# Patient Record
Sex: Female | Born: 1960 | Race: White | Hispanic: No | Marital: Married | Smoking: Never smoker
Health system: Southern US, Community
[De-identification: ages and names within clinical notes are randomized; demographics above are authoritative.]

## PROBLEM LIST (undated history)

## (undated) DIAGNOSIS — R002 Palpitations: Secondary | ICD-10-CM

## (undated) DIAGNOSIS — N200 Calculus of kidney: Secondary | ICD-10-CM

## (undated) DIAGNOSIS — I10 Essential (primary) hypertension: Secondary | ICD-10-CM

## (undated) HISTORY — DX: Calculus of kidney: N20.0

## (undated) HISTORY — DX: Essential (primary) hypertension: I10

---

## 2013-05-29 DIAGNOSIS — I10 Essential (primary) hypertension: Secondary | ICD-10-CM | POA: Insufficient documentation

## 2013-05-29 DIAGNOSIS — E782 Mixed hyperlipidemia: Secondary | ICD-10-CM | POA: Insufficient documentation

## 2013-12-05 DIAGNOSIS — G959 Disease of spinal cord, unspecified: Secondary | ICD-10-CM | POA: Insufficient documentation

## 2017-12-18 DIAGNOSIS — R002 Palpitations: Secondary | ICD-10-CM | POA: Insufficient documentation

## 2019-04-05 ENCOUNTER — Other Ambulatory Visit: Payer: Self-pay

## 2019-04-05 ENCOUNTER — Encounter: Payer: Self-pay | Admitting: *Deleted

## 2019-04-05 ENCOUNTER — Emergency Department
Admission: EM | Admit: 2019-04-05 | Discharge: 2019-04-05 | Disposition: A | Discharge status: Home / Self Care | Payer: BC Managed Care – PPO | Attending: Emergency Medicine | Admitting: Emergency Medicine

## 2019-04-05 ENCOUNTER — Emergency Department: Payer: BC Managed Care – PPO

## 2019-04-05 DIAGNOSIS — N200 Calculus of kidney: Secondary | ICD-10-CM | POA: Diagnosis not present

## 2019-04-05 DIAGNOSIS — R1031 Right lower quadrant pain: Secondary | ICD-10-CM | POA: Diagnosis present

## 2019-04-05 DIAGNOSIS — R110 Nausea: Secondary | ICD-10-CM | POA: Diagnosis not present

## 2019-04-05 HISTORY — DX: Palpitations: R00.2

## 2019-04-05 LAB — URINALYSIS, COMPLETE (UACMP) WITH MICROSCOPIC
Bilirubin Urine: NEGATIVE
Glucose, UA: NEGATIVE mg/dL
Ketones, ur: NEGATIVE mg/dL
Leukocytes,Ua: NEGATIVE
Nitrite: NEGATIVE
Protein, ur: NEGATIVE mg/dL
RBC / HPF: >50 RBC/hpf (ref 0–5)
Specific Gravity, Urine: 1.01 (ref 1.005–1.030)
pH: 5 (ref 5.0–8.0)

## 2019-04-05 LAB — COMPREHENSIVE METABOLIC PANEL
ALT: 16 U/L (ref 0–44)
AST: 21 U/L (ref 15–41)
Albumin: 4.6 g/dL (ref 3.5–5.0)
Alkaline Phosphatase: 67 U/L (ref 38–126)
Anion gap: 8 (ref 5–15)
BUN: 30 mg/dL — ABNORMAL HIGH (ref 6–20)
CO2: 26 mmol/L (ref 22–32)
Calcium: 9.3 mg/dL (ref 8.9–10.3)
Chloride: 105 mmol/L (ref 98–111)
Creatinine, Ser: 1.01 mg/dL — ABNORMAL HIGH (ref 0.44–1.00)
GFR calc Af Amer: >60 mL/min (ref >60)
GFR calc non Af Amer: >60 mL/min (ref >60)
Glucose, Bld: 151 mg/dL — ABNORMAL HIGH (ref 70–99)
Potassium: 4.3 mmol/L (ref 3.5–5.1)
Sodium: 139 mmol/L (ref 135–145)
Total Bilirubin: 0.7 mg/dL (ref 0.3–1.2)
Total Protein: 7.5 g/dL (ref 6.5–8.1)

## 2019-04-05 LAB — CBC
HCT: 39.1 % (ref 36.0–46.0)
Hemoglobin: 12.2 g/dL (ref 12.0–15.0)
MCH: 28 pg (ref 26.0–34.0)
MCHC: 31.2 g/dL (ref 30.0–36.0)
MCV: 89.9 fL (ref 80.0–100.0)
Platelets: 163 10*3/uL (ref 150–400)
RBC: 4.35 MIL/uL (ref 3.87–5.11)
RDW: 13.7 % (ref 11.5–15.5)
WBC: 7.7 10*3/uL (ref 4.0–10.5)
nRBC: 0 % (ref 0.0–0.2)

## 2019-04-05 LAB — LIPASE, BLOOD: Lipase: 23 U/L (ref 11–51)

## 2019-04-05 MED ORDER — ONDANSETRON 4 MG PO TBDP: 4.0000 mg | ORAL_TABLET | Freq: Three times a day (TID) | ORAL | 0 refills | Status: DC | PRN

## 2019-04-05 MED ORDER — MORPHINE SULFATE (PF) 4 MG/ML IV SOLN
4.0000 mg | Freq: Once | INTRAVENOUS | Status: AC
  Administered 2019-04-05: 4 mg via INTRAVENOUS
  Filled 2019-04-05: qty 1

## 2019-04-05 MED ORDER — IOHEXOL 300 MG/ML  SOLN
100.0000 mL | Freq: Once | INTRAMUSCULAR | Status: AC | PRN
  Administered 2019-04-05: 100 mL via INTRAVENOUS

## 2019-04-05 MED ORDER — KETOROLAC TROMETHAMINE 30 MG/ML IJ SOLN
30.0000 mg | Freq: Once | INTRAMUSCULAR | Status: AC
  Administered 2019-04-05: 30 mg via INTRAVENOUS
  Filled 2019-04-05: qty 1

## 2019-04-05 MED ORDER — ONDANSETRON HCL 4 MG/2ML IJ SOLN
4.0000 mg | Freq: Once | INTRAMUSCULAR | Status: AC
  Administered 2019-04-05: 4 mg via INTRAVENOUS
  Filled 2019-04-05: qty 2

## 2019-04-05 MED ORDER — OXYCODONE-ACETAMINOPHEN 5-325 MG PO TABS: 1.0000 | ORAL_TABLET | ORAL | 0 refills | Status: AC | PRN

## 2019-04-05 NOTE — ED Notes (Signed)
Pt transported to CT at this time.

## 2019-04-05 NOTE — ED Triage Notes (Signed)
Pt to ED reporting right lower abd pain that started suddenly at midnight. Pt reports she has been able to urinate since pain started but now feels as though she needs to urinate but is unable to do so. Normal BMs. No tenderness to abd. Nausea without vomiting. Pt unable to sit still in chair. NO hx of kidney stones.

## 2019-04-05 NOTE — ED Provider Notes (Signed)
[facility] Emergency Department Provider Note _________   First MD Initiated Contact with Patient 04/05/19 [phone]     (approximate)  I have reviewed the triage vital signs and the nursing notes.   HISTORY  Chief Complaint Abdominal Pain    HPI April Shields is a 59 y.o. female presents to the emergency department secondary to acute onset of right lower quadrant abdominal pain with associated nausea that began at midnight tonight.  Patient states that pain was 10 out of 10 currently 8 out of 10 with continued nausea.  Patient denies any vomiting no diarrhea or constipation.  Patient admits to urge to urinate but only "trickles.  Patient denies any fever.       Past Medical History:  Diagnosis Date  . Palpitations     There are no problems to display for this patient.   History reviewed. No pertinent surgical history.  Prior to Admission medications   Medication Sig Start Date End Date Taking? Authorizing Provider  ondansetron (ZOFRAN ODT) 4 MG disintegrating tablet Take 1 tablet (4 mg total) by mouth every 8 (eight) hours as needed. 04/05/19   Gregor Hams, MD  oxyCODONE-acetaminophen (PERCOCET) 5-325 MG tablet Take 1 tablet by mouth every 4 (four) hours as needed. 04/05/19 04/04/20  Gregor Hams, MD    Allergies Tape  History reviewed. No pertinent family history.  Social History Social History   Tobacco Use  . Smoking status: Never Smoker  . Smokeless tobacco: Never Used  Substance Use Topics  . Alcohol use: Not Currently  . Drug use: Never    Review of Systems Constitutional: No fever/chills Eyes: No visual changes. ENT: No sore throat. Cardiovascular: Denies chest pain. Respiratory: Denies shortness of breath. Gastrointestinal: Positive for abdominal pain and nausea no diarrhea.  No constipation. Genitourinary: Negative for dysuria. Musculoskeletal: Negative for neck pain.  Negative for back pain. Integumentary: Negative for  rash. Neurological: Negative for headaches, focal weakness or numbness.   ____________________________________________   PHYSICAL EXAM:  VITAL SIGNS: ED Triage Vitals [04/05/19 0342]  Enc Vitals Group     BP      Pulse      Resp      Temp      Temp src      SpO2      Weight 69.4 kg (153 lb)     Height 1.575 m (5\' 2" )     Head Circumference      Peak Flow      Pain Score 10     Pain Loc      Pain Edu?      Excl. in [place]?     Constitutional: Alert and oriented.  Apparent discomfort. Eyes: Conjunctivae are normal. . Mouth/Throat: Patient is wearing a mask. Neck: No stridor.  No meningeal signs.   Cardiovascular: Normal rate, regular rhythm. Good peripheral circulation. Grossly normal heart sounds. Respiratory: Normal respiratory effort.  No retractions. Gastrointestinal: Soft and nontender. No distention.  Musculoskeletal: No lower extremity tenderness nor edema. No gross deformities of extremities. Neurologic:  Normal speech and language. No gross focal neurologic deficits are appreciated.  Skin:  Skin is warm, dry and intact. Psychiatric: Mood and affect are normal. Speech and behavior are normal.  ____________________________________________   LABS (all labs ordered are listed, but only abnormal results are displayed)  Labs Reviewed  COMPREHENSIVE METABOLIC PANEL - Abnormal; Notable for the following components:      Result Value   Glucose, Bld 151 (*)  BUN 30 (*)    Creatinine, Ser 1.01 (*)    All other components within normal limits  LIPASE, BLOOD  CBC  URINALYSIS, COMPLETE (UACMP) WITH MICROSCOPIC     RADIOLOGY I, Ault N Trayquan Kolakowski, personally viewed and evaluated these images (plain radiographs) as part of my medical decision making, as well as reviewing the written report by the radiologist.  ED MD interpretation: 3 mm distal right urethral calculus with moderate right-sided hydronephrosis.  Official radiology report(s): CT ABDOMEN PELVIS W  CONTRAST  Result Date: 04/05/2019 CLINICAL DATA:  Right lower quadrant abdominal pain since early this morning. EXAM: CT ABDOMEN AND PELVIS WITH CONTRAST TECHNIQUE: Multidetector CT imaging of the abdomen and pelvis was performed using the standard protocol following bolus administration of intravenous contrast. CONTRAST:  OMNIPAQUE IOHEXOL 300 MG/ML  SOLN COMPARISON:  None. FINDINGS: Lower chest: The lung bases are clear of acute process. No pleural effusion or pulmonary lesions. A few tiny calcified granulomas are noted at the right lung base. The heart is normal in size. No pericardial effusion. The distal esophagus and aorta are unremarkable. Age advanced aortic calcifications. Hepatobiliary: No focal hepatic lesions or intrahepatic biliary dilatation. The gallbladder is normal. No common bile duct dilatation. Pancreas: No mass, inflammation or ductal dilatation. Spleen: Normal size. No focal lesions. Adrenals/Urinary Tract: The adrenal glands are normal. There is moderate right-sided hydroureteronephrosis down to an obstructing 3 mm calculus near the right UVJ. No renal calculi are identified. Both kidneys demonstrate normal enhancement/perfusion. No worrisome pulmonary lesions. The bladder is unremarkable. Stomach/Bowel: The stomach, duodenum, small bowel and colon are unremarkable. No acute inflammatory changes, mass lesions or obstructive findings. The terminal ileum is normal. The appendix is normal. Vascular/Lymphatic: Moderate age advanced atherosclerotic calcifications involving the aorta, branch vessel ostia and iliac arteries. No mesenteric or retroperitoneal mass or adenopathy. Reproductive: The uterus and ovaries are unremarkable. Other: No pelvic mass or adenopathy. No free pelvic fluid collections. No inguinal mass or adenopathy. No abdominal wall hernia or subcutaneous lesions. Musculoskeletal: No significant bony findings. IMPRESSION: 1. 3 mm distal right ureteral calculus causing moderate  right-sided hydroureteronephrosis. 2. No renal calculi. 3. No other acute abdominal/pelvic findings, mass lesions or adenopathy. 4. Age advanced atherosclerotic calcifications involving the aorta and branch vessels. Aortic Atherosclerosis (ICD10-I70.0). Electronically Signed   By: Rudie Meyer M.D.   On: 04/05/2019 05:02    ____________________________________________   PROCEDURES   Procedure(s) performed (including Critical Care):  Procedures   ____________________________________________   INITIAL IMPRESSION / MDM / ASSESSMENT AND PLAN / ED COURSE  As part of my medical decision making, I reviewed the following data within the electronic MEDICAL RECORD NUMBER  59 year old female presented with above-stated history and physical exam consistent with ureterolithiasis was confirmed on CT.  Patient given IV morphine and Zofran with improvement of pain.  After reviewing patient CT scan patient was given IV Toradol with resolution of pain at present.  ____________________________________________  FINAL CLINICAL IMPRESSION(S) / ED DIAGNOSES  Final diagnoses:  Kidney stone     MEDICATIONS GIVEN DURING THIS VISIT:  Medications  ketorolac (TORADOL) 30 MG/ML injection 30 mg (has no administration in time range)  morphine 4 MG/ML injection 4 mg (4 mg Intravenous Given 04/05/19 0357)  ondansetron (ZOFRAN) injection 4 mg (4 mg Intravenous Given 04/05/19 0358)  iohexol (OMNIPAQUE) 300 MG/ML solution 100 mL (100 mLs Intravenous Contrast Given 04/05/19 0436)     ED Discharge Orders         Ordered  oxyCODONE-acetaminophen (PERCOCET) 5-325 MG tablet  Every 4 hours PRN     04/05/19 0516    ondansetron (ZOFRAN ODT) 4 MG disintegrating tablet  Every 8 hours PRN     04/05/19 0516          *Please note:  Thanya A Deschler was evaluated in Emergency Department on 04/05/2019 for the symptoms described in the history of present illness. She was evaluated in the context of the global COVID-19 pandemic,  which necessitated consideration that the patient might be at risk for infection with the SARS-CoV-2 virus that causes COVID-19. Institutional protocols and algorithms that pertain to the evaluation of patients at risk for COVID-19 are in a state of rapid change based on information released by regulatory bodies including the CDC and federal and state organizations. These policies and algorithms were followed during the patient's care in the ED.  Some ED evaluations and interventions may be delayed as a result of limited staffing during the pandemic.*  Note:  This document was prepared using Dragon voice recognition software and may include unintentional dictation errors.   Darci Current, MD 04/05/19 0630

## 2019-04-06 ENCOUNTER — Encounter: Payer: Self-pay | Admitting: Urology

## 2019-04-06 ENCOUNTER — Ambulatory Visit: Payer: BC Managed Care – PPO | Admitting: Urology

## 2019-04-06 VITALS — BP 116/70 | HR 54 | Ht 62.0 in | Wt 154.3 lb

## 2019-04-06 DIAGNOSIS — I499 Cardiac arrhythmia, unspecified: Secondary | ICD-10-CM | POA: Insufficient documentation

## 2019-04-06 DIAGNOSIS — N201 Calculus of ureter: Secondary | ICD-10-CM | POA: Diagnosis not present

## 2019-04-06 MED ORDER — FLUCONAZOLE 150 MG PO TABS: 150.0000 mg | ORAL_TABLET | Freq: Once | ORAL | 0 refills | Status: AC

## 2019-04-06 NOTE — Addendum Note (Signed)
Addended by: Frankey Shown on: 04/06/2019 04:34 PM   Modules accepted: Orders

## 2019-04-06 NOTE — Addendum Note (Signed)
Addended by: Frankey Shown on: 04/06/2019 04:21 PM   Modules accepted: Orders

## 2019-04-06 NOTE — Patient Instructions (Signed)
Lithotripsy  Lithotripsy is a treatment that can sometimes help eliminate kidney stones and the pain that they cause. A form of lithotripsy, also known as extracorporeal shock wave lithotripsy, is a nonsurgical procedure that crushes a kidney stone with shock waves. These shock waves pass through your body and focus on the kidney stone. They cause the kidney stone to break up while it is still in the urinary tract. This makes it easier for the smaller pieces of stone to pass in the urine. Tell a health care provider about:  Any allergies you have.  All medicines you are taking, including vitamins, herbs, eye drops, creams, and over-the-counter medicines.  Any blood disorders you have.  Any surgeries you have had.  Any medical conditions you have.  Whether you are pregnant or may be pregnant.  Any problems you or family members have had with anesthetic medicines. What are the risks? Generally, this is a safe procedure. However, problems may occur, including:  Infection.  Bleeding of the kidney.  Bruising of the kidney or skin.  Scarring of the kidney, which can lead to: ? Increased blood pressure. ? Poor kidney function. ? Return (recurrence) of kidney stones.  Damage to other structures or organs, such as the liver, colon, spleen, or pancreas.  Blockage (obstruction) of the the tube that carries urine from the kidney to the bladder (ureter).  Failure of the kidney stone to break into pieces (fragments). What happens before the procedure? Staying hydrated Follow instructions from your health care provider about hydration, which may include:  Up to 2 hours before the procedure - you may continue to drink clear liquids, such as water, clear fruit juice, black coffee, and plain tea. Eating and drinking restrictions Follow instructions from your health care provider about eating and drinking, which may include:  8 hours before the procedure - stop eating heavy meals or foods  such as meat, fried foods, or fatty foods.  6 hours before the procedure - stop eating light meals or foods, such as toast or cereal.  6 hours before the procedure - stop drinking milk or drinks that contain milk.  2 hours before the procedure - stop drinking clear liquids. General instructions  Plan to have someone take you home from the hospital or clinic.  Ask your health care provider about: ? Changing or stopping your regular medicines. This is especially important if you are taking diabetes medicines or blood thinners. ? Taking medicines such as aspirin and ibuprofen. These medicines and other NSAIDs can thin your blood. Do not take these medicines for 7 days before your procedure if your health care provider instructs you not to.  You may have tests, such as: ? Blood tests. ? Urine tests. ? Imaging tests, such as a CT scan. What happens during the procedure?  To lower your risk of infection: ? Your health care team will wash or sanitize their hands. ? Your skin will be washed with soap.  An IV tube will be inserted into one of your veins. This tube will give you fluids and medicines.  You will be given one or more of the following: ? A medicine to help you relax (sedative). ? A medicine to make you fall asleep (general anesthetic).  A water-filled cushion may be placed behind your kidney or on your abdomen. In some cases you may be placed in a tub of lukewarm water.  Your body will be positioned in a way that makes it easy to target the kidney   stone.  A flexible tube with holes in it (stent) may be placed in the ureter. This will help keep urine flowing from the kidney if the fragments of the stone have been blocking the ureter.  An X-ray or ultrasound exam will be done to locate your stone.  Shock waves will be aimed at the stone. If you are awake, you may feel a tapping sensation as the shock waves pass through your body. The procedure may vary among health care  providers and hospitals. What happens after the procedure?  You may have an X-ray to see whether the procedure was able to break up the kidney stone and how much of the stone has passed. If large stone fragments remain after treatment, you may need to have a second procedure at a later time.  Your blood pressure, heart rate, breathing rate, and blood oxygen level will be monitored until the medicines you were given have worn off.  You may be given antibiotics or pain medicine as needed.  If a stent was placed in your ureter during surgery, it may stay in place for a few weeks.  You may need strain your urine to collect pieces of the kidney stone for testing.  You will need to drink plenty of water.  Do not drive for 24 hours if you were given a sedative. Summary  Lithotripsy is a treatment that can sometimes help eliminate kidney stones and the pain that they cause.  A form of lithotripsy, also known as extracorporeal shock wave lithotripsy, is a nonsurgical procedure that crushes a kidney stone with shock waves.  Generally, this is a safe procedure. However, problems may occur, including damage to the kidney or other organs, infection, or obstruction of the tube that carries urine from the kidney to the bladder (ureter).  When you go home, you will need to drink plenty of water. You may be asked to strain your urine to collect pieces of the kidney stone for testing. This information is not intended to replace advice given to you by your health care provider. Make sure you discuss any questions you have with your health care provider. Document Revised: 04/26/2018 Document Reviewed: 12/05/2015 Elsevier Patient Education  2020 Elsevier Inc.  

## 2019-04-06 NOTE — Progress Notes (Signed)
04/06/19 3:54 PM   April Shields [birth year] [id number]  CC: Right groin pain, urinary frequency  HPI: I saw April Shields in urology clinic today for evaluation of right groin pain and urinary frequency secondary to a distal ureteral stone.  She is a healthy 59 year old female with no history of nephrolithiasis who presented overnight on 04/05/2019 with severe right-sided groin pain and urinary frequency.  CT showed a 4-52mm right distal ureteral stone with moderate upstream hydronephrosis.  There were no other renal stones.  Urinalysis was benign with greater than 50 RBCs, 0-5 WBCs, rare bacteria, and nitrite negative, yeast present.  She had no leukocytosis and was afebrile and hemodynamically stable.  She denies any fevers or chills.  Her pain is poorly controlled with right lower groin pain and urinary frequency.   PMH: Past Medical History:  Diagnosis Date  . HTN (hypertension)   . Kidney stone   . Palpitations     Surgical History: No past surgical history on file.  Family History: No family history on file.  Social History:  reports that she has never smoked. She has never used smokeless tobacco. She reports previous alcohol use. She reports that she does not use drugs.  Physical Exam: BP 116/70 (BP Location: Left Arm, Patient Position: Sitting, Cuff Size: Normal)   Pulse (!) 54   Ht 5\' 2"  (1.575 m)   Wt 154 lb 4.8 oz (70 kg)   BMI 28.22 kg/m    Constitutional:  Alert and oriented, No acute distress. Cardiovascular: Regular rate and rhythm Respiratory: Clear to auscultation bilaterally GI: Abdomen is soft, nontender, nondistended, no abdominal masses Lymph: No cervical or inguinal lymphadenopathy. Skin: No rashes, bruises or suspicious lesions. Neurologic: Grossly intact, no focal deficits, moving all 4 extremities. Psychiatric: Normal mood and affect.  Laboratory Data: Reviewed, see HPI  Pertinent Imaging: Personally reviewed the CT dated 04/05/2018.  5 mm right distal  ureteral stone with upstream hydronephrosis, no other renal stones. 550HU, clearly seen on scout CT, 9cm SSD  Assessment & Plan:   She is a 59 year old female with poorly controlled right-sided flank pain and urinary frequency secondary to a 5 mm right distal ureteral stone.  She has no signs of systemic infection.  There was yeast present on her original urinalysis, but urinalysis was otherwise completely benign with 0 WBCs.  One-time dose of fluconazole given.    We discussed various treatment options for urolithiasis including observation with or without medical expulsive therapy, shockwave lithotripsy (SWL), ureteroscopy and laser lithotripsy with stent placement, and percutaneous nephrolithotomy.  We discussed that management is based on stone size, location, density, patient co-morbidities, and patient preference.   Stones <34mm in size have a >80% spontaneous passage rate. Data surrounding the use of tamsulosin for medical expulsive therapy is controversial, but meta analyses suggests it is most efficacious for distal stones between 5-23mm in size. Possible side effects include dizziness/lightheadedness, and retrograde ejaculation.  SWL has a lower stone free rate in a single procedure, but also a lower complication rate compared to ureteroscopy and avoids a stent and associated stent related symptoms. Possible complications include renal hematoma, steinstrasse, and need for additional treatment.  Ureteroscopy with laser lithotripsy and stent placement has a higher stone free rate than SWL in a single procedure, however increased complication rate including possible infection, ureteral injury, bleeding, and stent related morbidity. Common stent related symptoms include dysuria, urgency/frequency, and flank pain.  After an extensive discussion of the risks and benefits of the above  treatment options, the patient would like to proceed with right shockwave lithotripsy.  Brian Sninsky,  MD 04/06/2019  [facility] [address], [address] [place], [place] [address] [phone]  

## 2019-04-06 NOTE — H&P (View-Only) (Signed)
04/06/19 3:54 PM   April Shields [birth year] [id number]  CC: Right groin pain, urinary frequency  HPI: I saw April Shields in urology clinic today for evaluation of right groin pain and urinary frequency secondary to a distal ureteral stone.  She is a healthy 59 year old female with no history of nephrolithiasis who presented overnight on 04/05/2019 with severe right-sided groin pain and urinary frequency.  CT showed a 4-52mm right distal ureteral stone with moderate upstream hydronephrosis.  There were no other renal stones.  Urinalysis was benign with greater than 50 RBCs, 0-5 WBCs, rare bacteria, and nitrite negative, yeast present.  She had no leukocytosis and was afebrile and hemodynamically stable.  She denies any fevers or chills.  Her pain is poorly controlled with right lower groin pain and urinary frequency.   PMH: Past Medical History:  Diagnosis Date  . HTN (hypertension)   . Kidney stone   . Palpitations     Surgical History: No past surgical history on file.  Family History: No family history on file.  Social History:  reports that she has never smoked. She has never used smokeless tobacco. She reports previous alcohol use. She reports that she does not use drugs.  Physical Exam: BP 116/70 (BP Location: Left Arm, Patient Position: Sitting, Cuff Size: Normal)   Pulse (!) 54   Ht 5\' 2"  (1.575 m)   Wt 154 lb 4.8 oz (70 kg)   BMI 28.22 kg/m    Constitutional:  Alert and oriented, No acute distress. Cardiovascular: Regular rate and rhythm Respiratory: Clear to auscultation bilaterally GI: Abdomen is soft, nontender, nondistended, no abdominal masses Lymph: No cervical or inguinal lymphadenopathy. Skin: No rashes, bruises or suspicious lesions. Neurologic: Grossly intact, no focal deficits, moving all 4 extremities. Psychiatric: Normal mood and affect.  Laboratory Data: Reviewed, see HPI  Pertinent Imaging: Personally reviewed the CT dated 04/05/2018.  5 mm right distal  ureteral stone with upstream hydronephrosis, no other renal stones. 550HU, clearly seen on scout CT, 9cm SSD  Assessment & Plan:   She is a 59 year old female with poorly controlled right-sided flank pain and urinary frequency secondary to a 5 mm right distal ureteral stone.  She has no signs of systemic infection.  There was yeast present on her original urinalysis, but urinalysis was otherwise completely benign with 0 WBCs.  One-time dose of fluconazole given.    We discussed various treatment options for urolithiasis including observation with or without medical expulsive therapy, shockwave lithotripsy (SWL), ureteroscopy and laser lithotripsy with stent placement, and percutaneous nephrolithotomy.  We discussed that management is based on stone size, location, density, patient co-morbidities, and patient preference.   Stones <34mm in size have a >80% spontaneous passage rate. Data surrounding the use of tamsulosin for medical expulsive therapy is controversial, but meta analyses suggests it is most efficacious for distal stones between 5-23mm in size. Possible side effects include dizziness/lightheadedness, and retrograde ejaculation.  SWL has a lower stone free rate in a single procedure, but also a lower complication rate compared to ureteroscopy and avoids a stent and associated stent related symptoms. Possible complications include renal hematoma, steinstrasse, and need for additional treatment.  Ureteroscopy with laser lithotripsy and stent placement has a higher stone free rate than SWL in a single procedure, however increased complication rate including possible infection, ureteral injury, bleeding, and stent related morbidity. Common stent related symptoms include dysuria, urgency/frequency, and flank pain.  After an extensive discussion of the risks and benefits of the above  treatment options, the patient would like to proceed with right shockwave lithotripsy.  Nickolas Madrid,  MD 04/06/2019  [facility] [address] [place], [place] [address] [phone]

## 2019-04-07 ENCOUNTER — Encounter: Payer: Self-pay | Admitting: Urology

## 2019-04-07 ENCOUNTER — Encounter
Admission: RE | Disposition: A | Discharge status: Home / Self Care | Payer: Self-pay | Source: Home / Self Care | Attending: Urology

## 2019-04-07 ENCOUNTER — Other Ambulatory Visit: Payer: Self-pay

## 2019-04-07 ENCOUNTER — Other Ambulatory Visit: Payer: Self-pay | Admitting: Radiology

## 2019-04-07 ENCOUNTER — Ambulatory Visit
Admission: RE | Admit: 2019-04-07 | Discharge: 2019-04-07 | Disposition: A | Discharge status: Home / Self Care | Payer: BC Managed Care – PPO | Attending: Urology | Admitting: Urology

## 2019-04-07 ENCOUNTER — Ambulatory Visit: Payer: BC Managed Care – PPO

## 2019-04-07 DIAGNOSIS — Z87442 Personal history of urinary calculi: Secondary | ICD-10-CM | POA: Insufficient documentation

## 2019-04-07 DIAGNOSIS — N200 Calculus of kidney: Secondary | ICD-10-CM

## 2019-04-07 DIAGNOSIS — N201 Calculus of ureter: Secondary | ICD-10-CM

## 2019-04-07 DIAGNOSIS — N132 Hydronephrosis with renal and ureteral calculous obstruction: Secondary | ICD-10-CM | POA: Insufficient documentation

## 2019-04-07 DIAGNOSIS — I10 Essential (primary) hypertension: Secondary | ICD-10-CM | POA: Insufficient documentation

## 2019-04-07 HISTORY — PX: EXTRACORPOREAL SHOCK WAVE LITHOTRIPSY: SHX1557

## 2019-04-07 SURGERY — LITHOTRIPSY, ESWL
Anesthesia: Moderate Sedation | Laterality: Right

## 2019-04-07 MED ORDER — ONDANSETRON HCL 4 MG/2ML IJ SOLN
4.0000 mg | Freq: Once | INTRAMUSCULAR | Status: AC
  Administered 2019-04-07: 4 mg via INTRAVENOUS

## 2019-04-07 MED ORDER — DIAZEPAM 5 MG PO TABS
ORAL_TABLET | ORAL | Status: AC
  Filled 2019-04-07: qty 2

## 2019-04-07 MED ORDER — CIPROFLOXACIN HCL 500 MG PO TABS
500.0000 mg | ORAL_TABLET | ORAL | Status: AC
  Administered 2019-04-07: 500 mg via ORAL

## 2019-04-07 MED ORDER — CIPROFLOXACIN HCL 500 MG PO TABS
ORAL_TABLET | ORAL | Status: AC
  Filled 2019-04-07: qty 1

## 2019-04-07 MED ORDER — SODIUM CHLORIDE 0.9 % IV SOLN: INTRAVENOUS | Status: DC

## 2019-04-07 MED ORDER — DIAZEPAM 5 MG PO TABS
10.0000 mg | ORAL_TABLET | ORAL | Status: AC
  Administered 2019-04-07: 13:00:00 10 mg via ORAL

## 2019-04-07 MED ORDER — DIPHENHYDRAMINE HCL 25 MG PO CAPS
ORAL_CAPSULE | ORAL | Status: AC
  Filled 2019-04-07: qty 1

## 2019-04-07 MED ORDER — DIPHENHYDRAMINE HCL 25 MG PO CAPS
25.0000 mg | ORAL_CAPSULE | ORAL | Status: AC
  Administered 2019-04-07: 25 mg via ORAL

## 2019-04-07 MED ORDER — ALFUZOSIN HCL ER 10 MG PO TB24: 10.0000 mg | ORAL_TABLET | Freq: Every day | ORAL | 0 refills | Status: DC

## 2019-04-07 MED ORDER — ONDANSETRON HCL 4 MG/2ML IJ SOLN
INTRAMUSCULAR | Status: AC
  Filled 2019-04-07: qty 2

## 2019-04-07 NOTE — Discharge Instructions (Addendum)
FOLLOW [facility] DISCHARGE INSTRUCTION SHEET.  AMBULATORY SURGERY  DISCHARGE INSTRUCTIONS   1) The drugs that you were given will stay in your system until tomorrow so for the next 24 hours you should not:  A) Drive an automobile B) Make any legal decisions C) Drink any alcoholic beverage   2) You may resume regular meals tomorrow.  Today it is better to start with liquids and gradually work up to solid foods.  You may eat anything you prefer, but it is better to start with liquids, then soup and crackers, and gradually work up to solid foods.   3) Please notify your doctor immediately if you have any unusual bleeding, trouble breathing, redness and pain at the surgery site, drainage, fever, or pain not relieved by medication.    4) Additional Instructions: You will be contacted by our office for a follow-up visit in 1-2 weeks.  A prescription was sent to your pharmacy for alfuzosin which is a medication which will help you pass stone fragments.        Please contact your physician with any problems or Same Day Surgery at [phone], Monday through Friday 6 am to 4 pm, or [place] at [facility] number at [phone].

## 2019-04-07 NOTE — Interval H&P Note (Signed)
History and Physical Interval Note:  04/07/2019 4:35 PM  April Shields A Dunlap  has presented today for surgery, with the diagnosis of Kidney stone.  The various methods of treatment have been discussed with the patient and family. After consideration of risks, benefits and other options for treatment, the patient has consented to  Procedure(s): EXTRACORPOREAL SHOCK WAVE LITHOTRIPSY (ESWL) (Right) as a surgical intervention.  The patient's history has been reviewed, patient examined, no change in status, stable for surgery.  I have reviewed the patient's chart and labs.  Questions were answered to the patient's satisfaction.     Sierah Lacewell C Chailyn Racette

## 2019-04-13 ENCOUNTER — Ambulatory Visit: Payer: BC Managed Care – PPO | Admitting: Urology

## 2019-04-18 ENCOUNTER — Ambulatory Visit: Payer: BC Managed Care – PPO | Admitting: Physician Assistant

## 2019-04-18 ENCOUNTER — Ambulatory Visit
Admission: RE | Admit: 2019-04-18 | Discharge: 2019-04-18 | Disposition: A | Discharge status: Home / Self Care | Payer: BC Managed Care – PPO | Source: Ambulatory Visit | Attending: Urology | Admitting: Urology

## 2019-04-18 ENCOUNTER — Other Ambulatory Visit: Payer: Self-pay

## 2019-04-18 VITALS — BP 130/79 | HR 53 | Ht 62.0 in | Wt 156.0 lb

## 2019-04-18 DIAGNOSIS — N201 Calculus of ureter: Secondary | ICD-10-CM | POA: Diagnosis not present

## 2019-04-18 MED ORDER — TAMSULOSIN HCL 0.4 MG PO CAPS: 0.4000 mg | ORAL_CAPSULE | Freq: Every day | ORAL | 0 refills | Status: DC

## 2019-04-18 NOTE — Progress Notes (Signed)
04/18/2019 9:31 AM   April Shields [birth year] [id number]  CC: Postop ESWL  HPI: April Shields is a 59 y.o. female who presents for postoperative evaluation s/p ESWL with Dr. Bernardo Heater on 04/07/2019 for management of a 4-5 mm distal right ureteral stone.  Operative findings notable for fragmentation of the stone under fluoroscopy.  Today, she reports improvement in her pain since procedure. She denies fever, chills, nausea, and vomiting. She reports intermittent mild gross hematuria. She has been passing fragments and brings some with her to clinic today. She is not taking Flomax. She reports she needs to increase her fluid intake  Follow-up KUB today revealed persistence of the distal right ureteral stone.  PMH: Past Medical History:  Diagnosis Date  . HTN (hypertension)   . Kidney stone   . Palpitations     Surgical History: Past Surgical History:  Procedure Laterality Date  . EXTRACORPOREAL SHOCK WAVE LITHOTRIPSY Right 04/07/2019   Procedure: EXTRACORPOREAL SHOCK WAVE LITHOTRIPSY (ESWL);  Surgeon: Abbie Sons, MD;  Location: [facility] ORS;  Service: Urology;  Laterality: Right;    Home Medications:  Allergies as of 04/18/2019      Reactions   Tape Rash      Medication List       Accurate as of April 18, 2019  9:31 AM. If you have any questions, ask your nurse or doctor.        alfuzosin 10 MG 24 hr tablet Commonly known as: UROXATRAL Take 1 tablet (10 mg total) by mouth daily with breakfast.   atorvastatin 40 MG tablet Commonly known as: LIPITOR Take 40 mg by mouth every morning.   atorvastatin 80 MG tablet Commonly known as: LIPITOR Take 80 mg by mouth every morning.   Bayer Aspirin EC Low Dose 81 MG EC tablet Generic drug: aspirin Take 81 mg by mouth daily. Swallow whole.   famotidine 20 MG tablet Commonly known as: PEPCID Take 20 mg by mouth 2 (two) times daily.   fluconazole 150 MG tablet Commonly known as: DIFLUCAN Take 150 mg by mouth once.     hydrochlorothiazide 12.5 MG tablet Commonly known as: HYDRODIURIL Take 12.5 mg by mouth daily.   lisinopril 2.5 MG tablet Commonly known as: ZESTRIL Take 2.5 mg by mouth 2 (two) times daily.   mesalamine 0.375 g 24 hr capsule Commonly known as: APRISO Take 1.5 g by mouth every morning.   metoprolol succinate 25 MG 24 hr tablet Commonly known as: TOPROL-XL Take 25 mg by mouth daily.   ondansetron 4 MG disintegrating tablet Commonly known as: Zofran ODT Take 1 tablet (4 mg total) by mouth every 8 (eight) hours as needed.   ondansetron 4 MG tablet Commonly known as: ZOFRAN Take by mouth.   oxyCODONE-acetaminophen 5-325 MG tablet Commonly known as: Percocet Take 1 tablet by mouth every 4 (four) hours as needed.       Allergies:  Allergies  Allergen Reactions  . Tape Rash    Family History: No family history on file.  Social History:  reports that she has never smoked. She has never used smokeless tobacco. She reports previous alcohol use. She reports that she does not use drugs.  Physical Exam: BP 130/79   Pulse (!) 53   Ht 5\' 2"  (1.575 m)   Wt 156 lb (70.8 kg)   BMI 28.53 kg/m   Constitutional:  Alert and oriented, No acute distress. HEENT: [place] AT, moist mucus membranes.  Trachea midline, no masses. Cardiovascular: No clubbing,  cyanosis, or edema. Respiratory: Normal respiratory effort, no increased work of breathing. Skin: No rashes, bruises or suspicious lesions. Neurologic: Grossly intact, no focal deficits, moving all 4 extremities. Psychiatric: Normal mood and affect.  Pertinent Imaging: KUB 04/18/2019: CLINICAL DATA:  Right ureteral stone since 03/09, lithotripsy on 03/11.  EXAM: ABDOMEN - 1 VIEW  COMPARISON:  CT of the abdomen and pelvis dated 04/05/2019, plain film abdomen 04/07/2019  FINDINGS: Pattern of calcifications over the pelvis is unchanged since the recent plain film evaluation.  Lung bases are clear.  Bowel gas pattern is  unremarkable.  No abnormal calcifications over the renal contours, partially obscured by stool and gas.  Degenerative changes in the right hip. No acute bone finding.  IMPRESSION: Pattern of calcifications over the pelvis is unchanged since the recent plain film evaluation.  Comparison with CT shows that there are 2 phleboliths in the right hemipelvis and 2 calcifications just over the symphysis pubis. Accounting for these calcifications, the distal right ureteral calculus may no longer be present. Correlate with patient's symptoms and CT as warranted for further evaluation.   Electronically Signed   By: Donzetta Kohut M.D.   On: 04/18/2019 09:03  I personally reviewed the images referenced above and note persistent distal right ureteral stone.  Assessment & Plan:   1. Right ureteral stone 59 year old female who recently underwent ESWL for management of a 4-5 mm distal right ureteral stone presents today for follow-up.  Stone was noted to fragment on fluoroscopy during procedure.  She has been passing fragments.  KUB today notable for persistence of the distal right ureteral stone.  In the setting of her symptomatic improvement, recommend continued trial of passage of stone fragments.  I have prescribed Flomax 0.4 mg daily x1 month and would like her to follow-up with me in clinic in 1 month with a renal ultrasound prior to evaluate her for resolution of right hydronephrosis.  Counseled her to push fluids in the interim.  We will send her stone fragments for analysis.  I reviewed warning signs of kidney stones including sudden onset flank or abdominal pain, fever, chills, nausea, and vomiting, and counseled the patient to contact her office immediately if she develops these in the interim.  If these occur outside of office hours, I counseled her to proceed to the emergency room.  She expressed understanding. - US RENAL; Future - tamsulosin (FLOMAX) 0.4 MG CAPS capsule; Take 1  capsule (0.4 mg total) by mouth daily.  Dispense: 30 capsule; Refill: 0   Return in about 4 weeks (around 05/16/2019) for Stone f/u with RUS prior.  Carman Ching, PA-C  [facility] [address] [place], [place] [address] [phone]

## 2019-04-26 ENCOUNTER — Other Ambulatory Visit: Payer: Self-pay | Admitting: Physician Assistant

## 2019-05-11 ENCOUNTER — Ambulatory Visit
Admission: RE | Admit: 2019-05-11 | Discharge: 2019-05-11 | Disposition: A | Discharge status: Home / Self Care | Payer: BC Managed Care – PPO | Source: Ambulatory Visit | Attending: Physician Assistant | Admitting: Physician Assistant

## 2019-05-11 ENCOUNTER — Other Ambulatory Visit: Payer: Self-pay

## 2019-05-11 DIAGNOSIS — N201 Calculus of ureter: Secondary | ICD-10-CM | POA: Diagnosis not present

## 2019-05-16 ENCOUNTER — Ambulatory Visit (INDEPENDENT_AMBULATORY_CARE_PROVIDER_SITE_OTHER): Payer: BC Managed Care – PPO | Admitting: Physician Assistant

## 2019-05-16 ENCOUNTER — Other Ambulatory Visit: Payer: Self-pay

## 2019-05-16 VITALS — BP 145/84 | HR 58 | Ht 62.0 in | Wt 156.0 lb

## 2019-05-16 DIAGNOSIS — N201 Calculus of ureter: Secondary | ICD-10-CM

## 2019-05-16 DIAGNOSIS — Z87442 Personal history of urinary calculi: Secondary | ICD-10-CM

## 2019-05-16 LAB — MICROSCOPIC EXAMINATION

## 2019-05-16 LAB — URINALYSIS, COMPLETE
Bilirubin, UA: NEGATIVE
Glucose, UA: NEGATIVE
Ketones, UA: NEGATIVE
Nitrite, UA: NEGATIVE
Protein,UA: NEGATIVE
Specific Gravity, UA: 1.025 (ref 1.005–1.030)
Urobilinogen, Ur: 0.2 mg/dL (ref 0.2–1.0)
pH, UA: 6 (ref 5.0–7.5)

## 2019-05-16 NOTE — Progress Notes (Signed)
05/16/2019 10:22 AM   April Shields [birth year] [id number]  CC: Joaquim Lai follow-up  HPI: April Shields is a 59 y.o. female who presents today for second follow-up appointment following ESWL with Dr. Bernardo Heater on 04/07/2019 for management of a 4 to 5 mm distal right ureteral stone.  Operative findings notable for fragmentation of the stone under fluoroscopy.  I saw her in clinic on 04/18/2019, at which point she reported improvement in her pain since the procedure.  She did have intermittent mild gross hematuria at that time.  She brought stone fragments with her to clinic that day.  KUB revealed persistence of the distal right ureteral stone.  I counseled her to continue Flomax and push fluids with plans for follow-up renal ultrasound and recheck in 1 month.  Today, she reports continued resolution of her flank pain.  She denies gross hematuria.  She is no longer straining her urine, but does not believe she has seen any fragments pass.  Stone analysis resulted with 99% organic material/cellulose, 1% calcium oxalate monohydrate.  Renal ultrasound on 05/11/2019 revealed no nephrolithiasis or hydronephrosis.  In-office UA today positive for trace-lysed blood and 1+ leukocyte esterase; urine microscopy pan negative.  PMH: Past Medical History:  Diagnosis Date  . HTN (hypertension)   . Kidney stone   . Palpitations     Surgical History: Past Surgical History:  Procedure Laterality Date  . EXTRACORPOREAL SHOCK WAVE LITHOTRIPSY Right 04/07/2019   Procedure: EXTRACORPOREAL SHOCK WAVE LITHOTRIPSY (ESWL);  Surgeon: Abbie Sons, MD;  Location: [facility] ORS;  Service: Urology;  Laterality: Right;    Home Medications:  Allergies as of 05/16/2019      Reactions   Tape Rash      Medication List       Accurate as of May 16, 2019 10:22 AM. If you have any questions, ask your nurse or doctor.        alfuzosin 10 MG 24 hr tablet Commonly known as: UROXATRAL Take 1 tablet (10 mg total) by mouth  daily with breakfast.   atorvastatin 40 MG tablet Commonly known as: LIPITOR Take 40 mg by mouth every morning.   atorvastatin 80 MG tablet Commonly known as: LIPITOR Take 80 mg by mouth every morning.   Bayer Aspirin EC Low Dose 81 MG EC tablet Generic drug: aspirin Take 81 mg by mouth daily. Swallow whole.   famotidine 20 MG tablet Commonly known as: PEPCID Take 20 mg by mouth 2 (two) times daily.   fluconazole 150 MG tablet Commonly known as: DIFLUCAN Take 150 mg by mouth once.   hydrochlorothiazide 12.5 MG tablet Commonly known as: HYDRODIURIL Take 12.5 mg by mouth daily.   lisinopril 2.5 MG tablet Commonly known as: ZESTRIL Take 2.5 mg by mouth 2 (two) times daily.   mesalamine 0.375 g 24 hr capsule Commonly known as: APRISO Take 1.5 g by mouth every morning.   metoprolol succinate 25 MG 24 hr tablet Commonly known as: TOPROL-XL Take 25 mg by mouth daily.   ondansetron 4 MG disintegrating tablet Commonly known as: Zofran ODT Take 1 tablet (4 mg total) by mouth every 8 (eight) hours as needed.   ondansetron 4 MG tablet Commonly known as: ZOFRAN Take by mouth.   oxyCODONE-acetaminophen 5-325 MG tablet Commonly known as: Percocet Take 1 tablet by mouth every 4 (four) hours as needed.   tamsulosin 0.4 MG Caps capsule Commonly known as: FLOMAX Take 1 capsule (0.4 mg total) by mouth daily.  Allergies:  Allergies  Allergen Reactions  . Tape Rash    Family History: No family history on file.  Social History:   reports that she has never smoked. She has never used smokeless tobacco. She reports previous alcohol use. She reports that she does not use drugs.  Physical Exam: BP (!) 145/84   Pulse (!) 58   Ht 5\' 2"  (1.575 m)   Wt 156 lb (70.8 kg)   BMI 28.53 kg/m   Constitutional:  Alert and oriented, no acute distress, nontoxic appearing HEENT: , AT Cardiovascular: No clubbing, cyanosis, or edema Respiratory: Normal respiratory effort, no  increased work of breathing Skin: No rashes, bruises or suspicious lesions Neurologic: Grossly intact, no focal deficits, moving all 4 extremities Psychiatric: Normal mood and affect  Laboratory Data: Results for orders placed or performed in visit on 05/16/19  Microscopic Examination   URINE  Result Value Ref Range   WBC, UA 0-5 0 - 5 /hpf   RBC 0-2 0 - 2 /hpf   Epithelial Cells (non renal) 0-10 0 - 10 /hpf   Renal Epithel, UA 0-10 (A) None seen /hpf   Bacteria, UA Few None seen/Few  Urinalysis, Complete  Result Value Ref Range   Specific Gravity, UA 1.025 1.005 - 1.030   pH, UA 6.0 5.0 - 7.5   Color, UA Yellow Yellow   Appearance Ur Hazy (A) Clear   Leukocytes,UA 1+ (A) Negative   Protein,UA Negative Negative/Trace   Glucose, UA Negative Negative   Ketones, UA Negative Negative   RBC, UA Trace (A) Negative   Bilirubin, UA Negative Negative   Urobilinogen, Ur 0.2 0.2 - 1.0 mg/dL   Nitrite, UA Negative Negative   Microscopic Examination See below:    Pertinent Imaging: Results for orders placed during the hospital encounter of 05/11/19  05/13/19 RENAL   Narrative CLINICAL DATA:  Right ureteral stone follow-up. Lithotripsy 04/07/2019.  EXAM: RENAL / URINARY TRACT ULTRASOUND COMPLETE  COMPARISON:  CT 04/05/2019  FINDINGS: Right Kidney:  Renal measurements: 10.9 x 5.1 x 4.9 cm = volume: 143 mL . Echogenicity within normal limits. No mass or hydronephrosis visualized.  Left Kidney:  Renal measurements: 10.0 x 5.9 x 5.6 cm = volume: 158 mL. Echogenicity within normal limits. No mass or hydronephrosis visualized.  Bladder:  Appears normal for degree of bladder distention. Bilateral ureteral jets visualized.  Other:  None.  IMPRESSION: Normal size kidneys without hydronephrosis.  No nephrolithiasis.   Electronically Signed   By: 06/05/2019 M.D.   On: 05/11/2019 14:31    I personally reviewed the images referenced above and note the absence of bilateral  hydronephrosis or nephrolithiasis.  Assessment & Plan:   1. Right ureteral stone 59 year old female with a recent history of ESWL for management of a 4 to 5 mm distal right ureteral stone, stone analysis with calcium oxalate.  Symptoms resolved, no MH on UA today.  She is not believed to have any residual stones.  I counseled the patient on stone prevention techniques including increasing water intake with a goal of producing 2.5L of urine daily, increased citric acid intake, avoidance of high oxalate-containing foods, maintaining a moderate amount of dietary calcium intake, and decreasing salt intake.  Verbal and written instructions provided. - Urinalysis, Complete  2. History of nephrolithiasis Patient would prefer to maintain annual follow-up with KUB prior for stone monitoring.  I am in agreement with this plan. - DG Abd 1 View; Future   Return in about 1 year (  around 05/15/2020) for Stone f/u with KUB prior.  Carman Ching, PA-C  [facility] [address], [address] [place], [place] [address] [phone]

## 2020-04-30 DIAGNOSIS — I25119 Atherosclerotic heart disease of native coronary artery with unspecified angina pectoris: Secondary | ICD-10-CM | POA: Insufficient documentation

## 2020-04-30 DIAGNOSIS — R072 Precordial pain: Secondary | ICD-10-CM | POA: Insufficient documentation

## 2020-05-15 ENCOUNTER — Ambulatory Visit
Admission: RE | Admit: 2020-05-15 | Discharge: 2020-05-15 | Disposition: A | Discharge status: Home / Self Care | Payer: BC Managed Care – PPO | Source: Ambulatory Visit | Attending: Physician Assistant | Admitting: Physician Assistant

## 2020-05-15 ENCOUNTER — Other Ambulatory Visit: Payer: Self-pay

## 2020-05-15 ENCOUNTER — Encounter: Payer: Self-pay | Admitting: Physician Assistant

## 2020-05-15 ENCOUNTER — Ambulatory Visit
Admission: RE | Admit: 2020-05-15 | Discharge: 2020-05-15 | Disposition: A | Discharge status: Home / Self Care | Payer: BC Managed Care – PPO | Attending: Physician Assistant | Admitting: Physician Assistant

## 2020-05-15 ENCOUNTER — Ambulatory Visit: Payer: BC Managed Care – PPO | Admitting: Physician Assistant

## 2020-05-15 VITALS — BP 125/78 | HR 55 | Ht 62.0 in | Wt 160.0 lb

## 2020-05-15 DIAGNOSIS — R3914 Feeling of incomplete bladder emptying: Secondary | ICD-10-CM

## 2020-05-15 DIAGNOSIS — Z87442 Personal history of urinary calculi: Secondary | ICD-10-CM

## 2020-05-15 DIAGNOSIS — N3946 Mixed incontinence: Secondary | ICD-10-CM | POA: Diagnosis not present

## 2020-05-15 LAB — BLADDER SCAN AMB NON-IMAGING

## 2020-05-15 NOTE — Progress Notes (Signed)
05/15/2020 10:31 AM   April Shields 1960-08-14 [id number]  CC: Chief Complaint  Patient presents with  . Nephrolithiasis    HPI: April Shields is a 60 y.o. female with PMH nephrolithiasis who presents today for annual follow-up with KUB prior.  Today she reports feeling well.  She denies flank pain, dysuria, or gross hematuria over the past year.  She reports she sometimes feels she does not empty her bladder all the way but is unsure if this is related to her underlying colitis.  Additionally, she reports mixed urge and stress incontinence, with stress predominant and typically associated with sneezing and coughing.  She does not wear absorbent pads or underwear, but bring spare underwear with her to work.  Overall, she reports minimal bother with this.  PVR 13mL.  PMH: Past Medical History:  Diagnosis Date  . HTN (hypertension)   . Kidney stone   . Palpitations     Surgical History: Past Surgical History:  Procedure Laterality Date  . EXTRACORPOREAL SHOCK WAVE LITHOTRIPSY Right 04/07/2019   Procedure: EXTRACORPOREAL SHOCK WAVE LITHOTRIPSY (ESWL);  Surgeon: Riki Altes, MD;  Location: [facility] ORS;  Service: Urology;  Laterality: Right;    Home Medications:  Allergies as of 05/15/2020      Reactions   Tape Rash      Medication List       Accurate as of May 15, 2020 10:31 AM. If you have any questions, ask your nurse or doctor.        STOP taking these medications   fluconazole 150 MG tablet Commonly known as: DIFLUCAN Stopped by: Carman Ching, PA-C     TAKE these medications   alfuzosin 10 MG 24 hr tablet Commonly known as: UROXATRAL Take 1 tablet (10 mg total) by mouth daily with breakfast.   aspirin 81 MG EC tablet Take 81 mg by mouth daily. Swallow whole.   atorvastatin 40 MG tablet Commonly known as: LIPITOR Take 40 mg by mouth every morning.   atorvastatin 80 MG tablet Commonly known as: LIPITOR Take 80 mg by mouth every morning.    famotidine 20 MG tablet Commonly known as: PEPCID Take 20 mg by mouth 2 (two) times daily.   hydrochlorothiazide 12.5 MG tablet Commonly known as: HYDRODIURIL Take 12.5 mg by mouth daily.   lisinopril 2.5 MG tablet Commonly known as: ZESTRIL Take 2.5 mg by mouth 2 (two) times daily.   mesalamine 0.375 g 24 hr capsule Commonly known as: APRISO Take 1.5 g by mouth every morning.   metoprolol succinate 25 MG 24 hr tablet Commonly known as: TOPROL-XL Take 25 mg by mouth daily.   ondansetron 4 MG disintegrating tablet Commonly known as: Zofran ODT Take 1 tablet (4 mg total) by mouth every 8 (eight) hours as needed.   ondansetron 4 MG tablet Commonly known as: ZOFRAN Take by mouth.   tamsulosin 0.4 MG Caps capsule Commonly known as: FLOMAX Take 1 capsule (0.4 mg total) by mouth daily.       Allergies:  Allergies  Allergen Reactions  . Tape Rash    Family History: No family history on file.  Social History:   reports that she has never smoked. She has never used smokeless tobacco. She reports previous alcohol use. She reports that she does not use drugs.  Physical Exam: BP 125/78   Pulse (!) 55   Ht 5\' 2"  (1.575 m)   Wt 160 lb (72.6 kg)   BMI 29.26 kg/m   Constitutional:  Alert and oriented, no acute distress, nontoxic appearing HEENT: [place], AT Cardiovascular: No clubbing, cyanosis, or edema Respiratory: Normal respiratory effort, no increased work of breathing Skin: No rashes, bruises or suspicious lesions Neurologic: Grossly intact, no focal deficits, moving all 4 extremities Psychiatric: Normal mood and affect  Laboratory Data: Results for orders placed or performed in visit on 05/16/19  Microscopic Examination   URINE  Result Value Ref Range   WBC, UA 0-5 0 - 5 /hpf   RBC 0-2 0 - 2 /hpf   Epithelial Cells (non renal) 0-10 0 - 10 /hpf   Renal Epithel, UA 0-10 (A) None seen /hpf   Bacteria, UA Few None seen/Few  Urinalysis, Complete  Result Value  Ref Range   Specific Gravity, UA 1.025 1.005 - 1.030   pH, UA 6.0 5.0 - 7.5   Color, UA Yellow Yellow   Appearance Ur Hazy (A) Clear   Leukocytes,UA 1+ (A) Negative   Protein,UA Negative Negative/Trace   Glucose, UA Negative Negative   Ketones, UA Negative Negative   RBC, UA Trace (A) Negative   Bilirubin, UA Negative Negative   Urobilinogen, Ur 0.2 0.2 - 1.0 mg/dL   Nitrite, UA Negative Negative   Microscopic Examination See below:     Pertinent Imaging: KUB, 05/15/2020: CLINICAL DATA:  Nephrolithiasis  EXAM: ABDOMEN - 1 VIEW  COMPARISON:  04/18/2019  FINDINGS: Supine frontal views of the abdomen and pelvis are obtained. Bowel gas pattern is unremarkable. Stable vascular calcifications within the pelvis. No evidence of renal calculi. No acute bony abnormalities.  IMPRESSION: 1. Stable pelvic vascular calcifications. No evidence of radiopaque calculi.   Electronically Signed   By: Sharlet Salina M.D.   On: 05/15/2020 16:41  I personally reviewed the images referenced above and note no new radiopaque stones and stable pelvic calcifications.  Assessment & Plan:   1. History of nephrolithiasis Stable KUB today and patient remains asymptomatic.  Patient prefers annual follow-up with KUB prior and I am in agreement with this plan.  Counseled her to follow-up sooner with new flank pain, fever, chills, nausea, or vomiting.  She expressed understanding. - DG Abd 1 View; Future  2. Mixed incontinence urge and stress Minimal bother per patient. I explained that treatment options include pharmacotherapy for urge incontinence and pelvic floor PT for stress incontinence. She wishes to defer these pending symptomatic worsening and this is reasonable. Will continue to monitor.  3. Feeling of incomplete bladder emptying PVR WNL; no further intervention indicated. - BLADDER SCAN AMB NON-IMAGING   Return in about 1 year (around 05/15/2021) for Annual f/u with KUB prior + OAB  questionnaire.  Carman Ching, PA-C  [facility] [address] [place], [place] [address] [phone]

## 2021-05-14 NOTE — Progress Notes (Signed)
? ? ?05/15/2021 ?10:45 AM  ? ?April Shields ?01/02/1961 ?[id number] ? ?Referring provider: No referring provider defined for this encounter. ? ?Chief Complaint  ?Patient presents with  ? Nephrolithiasis  ? Urinary Incontinence  ? ?Urological history: ?1.  Nephrolithiasis ?-Stone composition of calcium oxalate ?-ESWL 03/2019 5 mm right distal stone ?-KUB no stones visualized  ? ?2. Mixed incontinence ?-Contributing factors of age, hypertension, vaginal atrophy and diuretics ?-PVR 21 mL ? ?HPI: ?April Shields is a 61 y.o. female who presents today for a one year follow up.  ? ?She is having 8 or more daytime urinations, nocturia x1-2 with a strong urge to urinate.  She is experiencing incontinence with stress and urge.  She leaks 1-2 times daily.  She does not wear absorbent products for her urinary leakage.  She engages in toilet mapping.  She does not restrict fluids. ? ?She has noticed an increase in her urinary frequency over the last several months, but she states her and her coworker have been encouraging each other to have more water intake. ? ?She has noticed a dark orange tinge to her urine from time to time and the last occurrence was 2 to 3 weeks ago.  She has not any issues with renal colic or passage of stones.   ? ?Patient denies any modifying or aggravating factors.  Patient denies any dysuria or suprapubic/flank pain.  Patient denies any fevers, chills, nausea or vomiting.   ? ?KUB no stones visualized    ? ?PVR 21 mL ? ?PMH: ?Past Medical History:  ?Diagnosis Date  ? HTN (hypertension)   ? Kidney stone   ? Palpitations   ? ? ?Surgical History: ?Past Surgical History:  ?Procedure Laterality Date  ? EXTRACORPOREAL SHOCK WAVE LITHOTRIPSY Right 04/07/2019  ? Procedure: EXTRACORPOREAL SHOCK WAVE LITHOTRIPSY (ESWL);  Surgeon: Abbie Sons, MD;  Location: [facility] ORS;  Service: Urology;  Laterality: Right;  ? ? ?Home Medications:  ?Allergies as of 05/15/2021   ? ?   Reactions  ? Yellow Jacket Venom   ? Tape Rash   ? ?  ? ?  ?Medication List  ?  ? ?  ? Accurate as of May 15, 2021 10:45 AM. If you have any questions, ask your nurse or doctor.  ?  ?  ? ?  ? ?alfuzosin 10 MG 24 hr tablet ?Commonly known as: UROXATRAL ?Take 1 tablet (10 mg total) by mouth daily with breakfast. ?  ?aspirin 81 MG EC tablet ?Take 81 mg by mouth daily. Swallow whole. ?  ?atorvastatin 40 MG tablet ?Commonly known as: LIPITOR ?Take 40 mg by mouth every morning. ?  ?atorvastatin 80 MG tablet ?Commonly known as: LIPITOR ?Take 80 mg by mouth every morning. ?  ?famotidine 20 MG tablet ?Commonly known as: PEPCID ?Take 20 mg by mouth 2 (two) times daily. ?  ?hydrochlorothiazide 12.5 MG tablet ?Commonly known as: HYDRODIURIL ?Take 12.5 mg by mouth daily. ?  ?lisinopril 2.5 MG tablet ?Commonly known as: ZESTRIL ?Take 2.5 mg by mouth 2 (two) times daily. ?  ?mesalamine 0.375 g 24 hr capsule ?Commonly known as: APRISO ?Take 1.5 g by mouth every morning. ?  ?metoprolol succinate 25 MG 24 hr tablet ?Commonly known as: TOPROL-XL ?Take 25 mg by mouth daily. ?  ?ondansetron 4 MG disintegrating tablet ?Commonly known as: Zofran ODT ?Take 1 tablet (4 mg total) by mouth every 8 (eight) hours as needed. ?  ?ondansetron 4 MG tablet ?Commonly known as: ZOFRAN ?Take by mouth. ?  ?  ranolazine 1000 MG SR tablet ?Commonly known as: RANEXA ?Take 1,000 mg by mouth 2 (two) times daily. ?  ?tamsulosin 0.4 MG Caps capsule ?Commonly known as: FLOMAX ?Take 1 capsule (0.4 mg total) by mouth daily. ?  ? ?  ? ? ?Allergies:  ?Allergies  ?Allergen Reactions  ? Yellow Jacket Venom   ? Tape Rash  ? ? ?Family History: ?No family history on file. ? ?Social History:  reports that she has never smoked. She has never used smokeless tobacco. She reports that she does not currently use alcohol. She reports that she does not use drugs. ? ?ROS: ?Pertinent ROS in HPI ? ?Physical Exam: ?BP 125/80   Pulse (!) 53   Ht 5' 2"  (1.575 m)   Wt 161 lb (73 kg)   BMI 29.45 kg/m?   ?Constitutional:  Well  nourished. Alert and oriented, No acute distress. ?HEENT: [place] AT, moist mucus membranes.  Trachea midline, no masses. ?Cardiovascular: No clubbing, cyanosis, or edema. ?Respiratory: Normal respiratory effort, no increased work of breathing. ?Neurologic: Grossly intact, no focal deficits, moving all 4 extremities. ?Psychiatric: Normal mood and affect.   ? ?Laboratory Data: ?Sodium 136 - 145 mmol/L 144   ?Potassium 3.5 - 5.1 mmol/L 4.1   ?Chloride 98 - 107 mmol/L 108 High    ?CO2 20.0 - 31.0 mmol/L 27.6   ?Anion Gap 3 - 11 mmol/L 8   ?BUN 9 - 23 mg/dL 26 High    ?Creatinine 0.50 - 0.80 mg/dL 1.01 High    ?BUN/Creatinine Ratio  26   ?eGFR CKD-EPI (2021) Female >=60 mL/min/1.39m 64   ?Comment: eGFR calculated with CKD-EPI 2021 equation in accordance with [employer] [place] [employer] Task [employer] recommendations.  ?Glucose 70 - 179 mg/dL 119   ?Calcium 8.7 - 10.4 mg/dL 8.6 Low    ?Resulting Agency  [facility] ?Specimen Collected: 11/23/20 14:13 Last Resulted: 11/23/20 15:24  ?[facility]: [facility] Result Received: 05/14/21 13:21  ? ?WBC 3.5 - 10.5 10*9/L 5.2   ?RBC 3.90 - 5.03 10*12/L 4.00   ?HGB 12.0 - 15.5 g/dL 11.6 Low    ?HCT 35.0 - 44.0 % 35.4   ?MCV 82.0 - 98.0 fL 88.4   ?MCH 26.0 - 34.0 pg 29.0   ?MCHC 30.0 - 36.0 g/dL 32.8   ?RDW 12.0 - 15.0 % 15.5 High    ?MPV 7.0 - 10.0 fL 10.5 High    ?Platelet 150 - 450 10*9/L 164   ?Resulting Agency  [facility] ?Specimen Collected: 10/30/20 15:31 Last Resulted: 10/30/20 15:35  ?[facility]: [facility] Result Received: 05/14/21 13:21  ?I have reviewed the labs.  ? ? ?Pertinent Imaging: ? 05/15/21 10:22  ?Scan Result 234m ? ?CLINICAL DATA:  Nephrolithiasis ?  ?EXAM: ?ABDOMEN - 1 VIEW ?  ?COMPARISON:  05/15/2020 ?  ?FINDINGS: ?Nonobstructive pattern of bowel gas. Generally large burden of stool ?throughout the colon, which somewhat limits radiographic evaluation ?for urinary tract  calculi. Within this limitation, small phleboliths ?are again seen in the low pelvis, without renal or ureteral calculi ?appreciated. No free air in the abdomen on supine radiograph. ?  ?IMPRESSION: ?Generally large burden of stool throughout the colon, which somewhat ?limits radiographic evaluation for urinary tract calculi. Within ?this limitation, small phleboliths are again seen in the low pelvis, ?without renal or ureteral calculi appreciated. ?  ?  ?Electronically Signed ?  By: AlDelanna Ahmadi.D. ?  On: 05/16/2021 16:18 ?I have independently reviewed the films.  See HPI.  ? ?Assessment & Plan:   ? ?1.  Nephrolithiasis ?- KUB no stones visualized ? ?2. Mixed incontinence ?-Cystoscopy pending ?-Patient did not want to try any medication at this time as she feels that she is already way too probably medicated and does not want to add another medication ?-I offered her a referral to physical therapy or PTNS and she deferred ? ?3. ? Gross hematuria ?-patient with a dark orange color to her urine on occasion and an increase in her urinary frequency  ?-she cannot give an UA at this appointment to check for micro heme ?-We will proceed with cystoscopy at this time to rule out any bladder pathology as she has had an increase in her frequency and possibly episodes of gross hematuria ?-will check UA at the time of cysto-if there is microscopic hematuria at the time of cystoscopy we may need to pursue a CT urogram pending cystoscopy findings ?-I have explained to the patient that they will  be scheduled for a cystoscopy in our office to evaluate their bladder.  The cystoscopy consists of passing a tube with a lens up through their urethra and into their urinary bladder.   We will inject the urethra with a lidocaine gel prior to introducing the cystoscope to help with any discomfort during the procedure.   After the procedure, they might experience blood in the urine and discomfort with urination.  This will abate after  the first few voids.  I have  encouraged the patient to increase water intake  during this time.  Patient denies any allergies to lidocaine.   ? ? ?Return for cysto for possible gross heme and increase in urinary f

## 2021-05-15 ENCOUNTER — Encounter: Payer: Self-pay | Admitting: Urology

## 2021-05-15 ENCOUNTER — Ambulatory Visit
Admission: RE | Admit: 2021-05-15 | Discharge: 2021-05-15 | Disposition: A | Discharge status: Home / Self Care | Payer: BC Managed Care – PPO | Attending: Physician Assistant | Admitting: Physician Assistant

## 2021-05-15 ENCOUNTER — Ambulatory Visit
Admission: RE | Admit: 2021-05-15 | Discharge: 2021-05-15 | Disposition: A | Discharge status: Home / Self Care | Payer: BC Managed Care – PPO | Source: Ambulatory Visit | Attending: Physician Assistant | Admitting: Physician Assistant

## 2021-05-15 ENCOUNTER — Ambulatory Visit: Payer: BC Managed Care – PPO | Admitting: Urology

## 2021-05-15 VITALS — BP 125/80 | HR 53 | Ht 62.0 in | Wt 161.0 lb

## 2021-05-15 DIAGNOSIS — Z87442 Personal history of urinary calculi: Secondary | ICD-10-CM | POA: Diagnosis present

## 2021-05-15 DIAGNOSIS — R310 Gross hematuria: Secondary | ICD-10-CM | POA: Diagnosis not present

## 2021-05-15 DIAGNOSIS — N3946 Mixed incontinence: Secondary | ICD-10-CM

## 2021-05-15 DIAGNOSIS — N200 Calculus of kidney: Secondary | ICD-10-CM

## 2021-05-15 LAB — BLADDER SCAN AMB NON-IMAGING

## 2021-05-22 ENCOUNTER — Other Ambulatory Visit: Payer: BC Managed Care – PPO | Admitting: Urology

## 2021-05-22 ENCOUNTER — Ambulatory Visit: Payer: BC Managed Care – PPO | Admitting: Urology

## 2021-05-22 ENCOUNTER — Encounter: Payer: Self-pay | Admitting: Urology

## 2021-05-22 VITALS — BP 162/83 | HR 60 | Ht 62.0 in | Wt 161.0 lb

## 2021-05-22 DIAGNOSIS — R310 Gross hematuria: Secondary | ICD-10-CM | POA: Diagnosis not present

## 2021-05-22 DIAGNOSIS — R350 Frequency of micturition: Secondary | ICD-10-CM

## 2021-05-22 LAB — URINALYSIS, COMPLETE
Bilirubin, UA: NEGATIVE
Glucose, UA: NEGATIVE
Ketones, UA: NEGATIVE
Nitrite, UA: NEGATIVE
Protein,UA: NEGATIVE
RBC, UA: NEGATIVE
Specific Gravity, UA: 1.02 (ref 1.005–1.030)
Urobilinogen, Ur: 0.2 mg/dL (ref 0.2–1.0)
pH, UA: 6.5 (ref 5.0–7.5)

## 2021-05-22 LAB — MICROSCOPIC EXAMINATION: Bacteria, UA: NONE SEEN

## 2021-05-22 NOTE — Progress Notes (Signed)
Cystoscopy Procedure Note: ? ?Indication: Patient was set up for cystoscopy by Michiel Cowboy, PA for questionable gross hematuria and increased urinary frequency ? ?After informed consent and discussion of the procedure and its risks, April Shields was positioned and prepped in the standard fashion. Cystoscopy was performed with a flexible cystoscope. The urethra, bladder neck and entire bladder was visualized in a standard fashion.  The bladder mucosa was grossly normal throughout with no abnormalities, no abnormalities on retroflexion.  Ureteral orifices orthotopic bilaterally. ? ?Urinalysis today benign with 0-2 RBCs, no need for further upper tract imaging ? ?Findings: ?Normal cystoscopy ? ?Assessment and Plan: ?Follow-up with urology as needed ? ?Legrand Rams, MD ?05/22/2021  ? ?

## 2021-05-29 NOTE — Progress Notes (Signed)
05/30/21 ?10:02 AM  ? ?April Shields ?September 30, 1960 ?[id number] ? ?Referring provider:  ?No referring provider defined for this encounter. ? ?Chief Complaint  ?Patient presents with  ? Dysuria  ? ? ?Urological history  ?1.  Nephrolithiasis ?-Stone composition of calcium oxalate ?-ESWL 03/2019 5 mm right distal stone ?-KUB no stones visualized  ?  ?2. Mixed incontinence ?-Contributing factors of age, hypertension, vaginal atrophy and diuretics  ? ?3. Gross hematuria  ?- non-smoker ?- Underwent cystoscopy on 05/22/2021 with Dr Versie Starks. It was unremarkable  ? ? ?HPI: ?April Shields is a 61 y.o.female who presents today for further evaluation of UTI symptoms.  ? ?Since Monday, she has been having dysuria.  Then yesterday she developed frequency and urgency and this morning she started having gross hematuria with clots.  Patient denies any modifying or aggravating factors.  Patient denies any dysuria or suprapubic/flank pain.  Patient denies any fevers, chills, nausea or vomiting.   ? ?UA nitrite positive, > 30 WBC's, > 30 RBC's and moderate.  ? ?PMH: ?Past Medical History:  ?Diagnosis Date  ? HTN (hypertension)   ? Kidney stone   ? Palpitations   ? ? ?Surgical History: ?Past Surgical History:  ?Procedure Laterality Date  ? EXTRACORPOREAL SHOCK WAVE LITHOTRIPSY Right 04/07/2019  ? Procedure: EXTRACORPOREAL SHOCK WAVE LITHOTRIPSY (ESWL);  Surgeon: Abbie Sons, MD;  Location: [facility] ORS;  Service: Urology;  Laterality: Right;  ? ? ?Home Medications:  ?Allergies as of 05/30/2021   ? ?   Reactions  ? Yellow Jacket Venom   ? Tape Rash  ? ?  ? ?  ?Medication List  ?  ? ?  ? Accurate as of May 30, 2021 10:02 AM. If you have any questions, ask your nurse or doctor.  ?  ?  ? ?  ? ?aspirin 81 MG EC tablet ?Take 81 mg by mouth daily. Swallow whole. ?  ?atorvastatin 40 MG tablet ?Commonly known as: LIPITOR ?Take 40 mg by mouth every morning. ?  ?famotidine 20 MG tablet ?Commonly known as: PEPCID ?Take 20 mg by mouth 2 (two) times daily. ?   ?mesalamine 0.375 g 24 hr capsule ?Commonly known as: APRISO ?Take 1.5 g by mouth every morning. ?  ?metoprolol succinate 25 MG 24 hr tablet ?Commonly known as: TOPROL-XL ?Take 25 mg by mouth daily. ?  ?ranolazine 1000 MG SR tablet ?Commonly known as: RANEXA ?Take 1,000 mg by mouth 2 (two) times daily. ?  ?sulfamethoxazole-trimethoprim 800-160 MG tablet ?Commonly known as: BACTRIM DS ?Take 1 tablet by mouth every 12 (twelve) hours. ?Started by: Zara Council, PA-C ?  ? ?  ? ? ?Allergies:  ?Allergies  ?Allergen Reactions  ? Yellow Jacket Venom   ? Tape Rash  ? ? ?Family History: ?No family history on file. ? ?Social History:  reports that she has never smoked. She has never been exposed to tobacco smoke. She has never used smokeless tobacco. She reports that she does not currently use alcohol. She reports that she does not use drugs. ? ? ?Physical Exam: ?BP (!) 147/87   Pulse 68   Ht 5\' 2"  (1.575 m)   Wt 161 lb (73 kg)   BMI 29.45 kg/m?   ?Constitutional:  Alert and oriented, No acute distress. ?HEENT: [place] AT, moist mucus membranes.  Trachea midline, no masses. ?Cardiovascular: No clubbing, cyanosis, or edema. ?Respiratory: Normal respiratory effort, no increased work of breathing. ?GI: Abdomen is soft, nontender, nondistended, no abdominal masses ?GU: No CVA tenderness ?Lymph:  No cervical or inguinal lymphadenopathy. ?Skin: No rashes, bruises or suspicious lesions. ?Neurologic: Grossly intact, no focal deficits, moving all 4 extremities. ?Psychiatric: Normal mood and affect. ? ?Laboratory Data: ?Urinalysis ?Component ?    Latest Ref Rng 05/30/2021  ?Glucose, UA ?    Negative  Negative   ?Specific Gravity, UA ?    1.005 - 1.030  1.025   ?pH, UA ?    5.0 - 7.5  7.0   ?Color, UA ?    Yellow  Red !   ?Appearance Ur ?    Clear  Cloudy !   ?Leukocytes,UA ?    Negative  3+ !   ?Protein,UA ?    Negative/Trace  3+ !   ?Ketones, UA ?    Negative  Trace !   ?RBC, UA ?    Negative  3+ !   ?Bilirubin, UA ?    Negative   Negative   ?Urobilinogen, Ur ?    0.2 - 1.0 mg/dL 1.0   ?Nitrite, UA ?    Negative  Positive !   ?Microscopic Examination See below:   ?  ?Component ?    Latest Ref Rng 05/30/2021  ?RBC ?    0 - 2 /hpf >30 !   ?WBC, UA ?    0 - 5 /hpf >30 !   ?Bacteria, UA ?    None seen/Few  Moderate !   ?Epithelial Cells (non renal) ?    0 - 10 /hpf 0-10   ?Casts ?    None seen /lpf Present !   ?Cast Type ?    N/A  Hyaline casts   ?I have reviewed the labs.  ? ?Pertinent Imaging: ?No urological imaging since last visit ? ?Assessment & Plan:   ? ?1.  Suspected UTI ?-UA grossly infected ?-urine sent for culture ?-Started on Septra DS twice daily for 7 days will adjust if necessary once urine culture and sensitivities have returned ? ?2.  Gross hematuria ?-Likely due to infection, but will have patient return in a month to recheck UA and symptoms ? ? ? ?Return in about 1 month (around 06/30/2021) for repeat UA and symptom recheck . ? ?[facility] ?[address] ?[place], [place] [address] ?[phone] ? ?I,Giavanna Kang,acting as a scribe for [employer], PA-C.,have documented all relevant documentation on the behalf of [place], PA-C,as directed by  [facility], PA-C while in the presence of [place], PA-C. ? ?

## 2021-05-30 ENCOUNTER — Ambulatory Visit: Payer: BC Managed Care – PPO | Admitting: Urology

## 2021-05-30 ENCOUNTER — Encounter: Payer: Self-pay | Admitting: Urology

## 2021-05-30 VITALS — BP 147/87 | HR 68 | Ht 62.0 in | Wt 161.0 lb

## 2021-05-30 DIAGNOSIS — R3915 Urgency of urination: Secondary | ICD-10-CM

## 2021-05-30 DIAGNOSIS — R300 Dysuria: Secondary | ICD-10-CM | POA: Diagnosis not present

## 2021-05-30 DIAGNOSIS — R310 Gross hematuria: Secondary | ICD-10-CM | POA: Diagnosis not present

## 2021-05-30 DIAGNOSIS — R350 Frequency of micturition: Secondary | ICD-10-CM | POA: Diagnosis not present

## 2021-05-30 DIAGNOSIS — R3989 Other symptoms and signs involving the genitourinary system: Secondary | ICD-10-CM

## 2021-05-30 LAB — MICROSCOPIC EXAMINATION
RBC, Urine: >30 /hpf — AB (ref 0–2)
WBC, UA: >30 /hpf — AB (ref 0–5)

## 2021-05-30 LAB — URINALYSIS, COMPLETE
Bilirubin, UA: NEGATIVE
Glucose, UA: NEGATIVE
Nitrite, UA: POSITIVE — AB
Specific Gravity, UA: 1.025 (ref 1.005–1.030)
Urobilinogen, Ur: 1 mg/dL (ref 0.2–1.0)
pH, UA: 7 (ref 5.0–7.5)

## 2021-05-30 MED ORDER — SULFAMETHOXAZOLE-TRIMETHOPRIM 800-160 MG PO TABS: 1.0000 | ORAL_TABLET | Freq: Two times a day (BID) | ORAL | 0 refills | Status: DC

## 2021-06-02 ENCOUNTER — Other Ambulatory Visit: Payer: Self-pay | Admitting: Urology

## 2021-06-02 DIAGNOSIS — R310 Gross hematuria: Secondary | ICD-10-CM

## 2021-06-02 LAB — CULTURE, URINE COMPREHENSIVE

## 2021-06-03 ENCOUNTER — Telehealth: Payer: Self-pay

## 2021-06-03 NOTE — Telephone Encounter (Signed)
Notified pt as advised, pt voiced understanding.  ?

## 2021-06-03 NOTE — Telephone Encounter (Signed)
-----   Message from [name], PA-C sent at 06/02/2021  8:20 PM EDT ----- ?Please let April Shields know that her urine culture was negative for infection.  We will need to get her scheduled for a CT urogram for further investigation into the blood and passing of the blood clots.  ?

## 2021-06-06 ENCOUNTER — Other Ambulatory Visit: Payer: BC Managed Care – PPO | Admitting: Urology

## 2021-06-14 IMAGING — CR DG ABDOMEN 1V
1 series · 1 of 1 positions shown · non-contrast
Comparison: April 05, 2019.

CLINICAL DATA: Right kidney stones.

EXAM:
ABDOMEN - 1 VIEW

[abdomen kub]
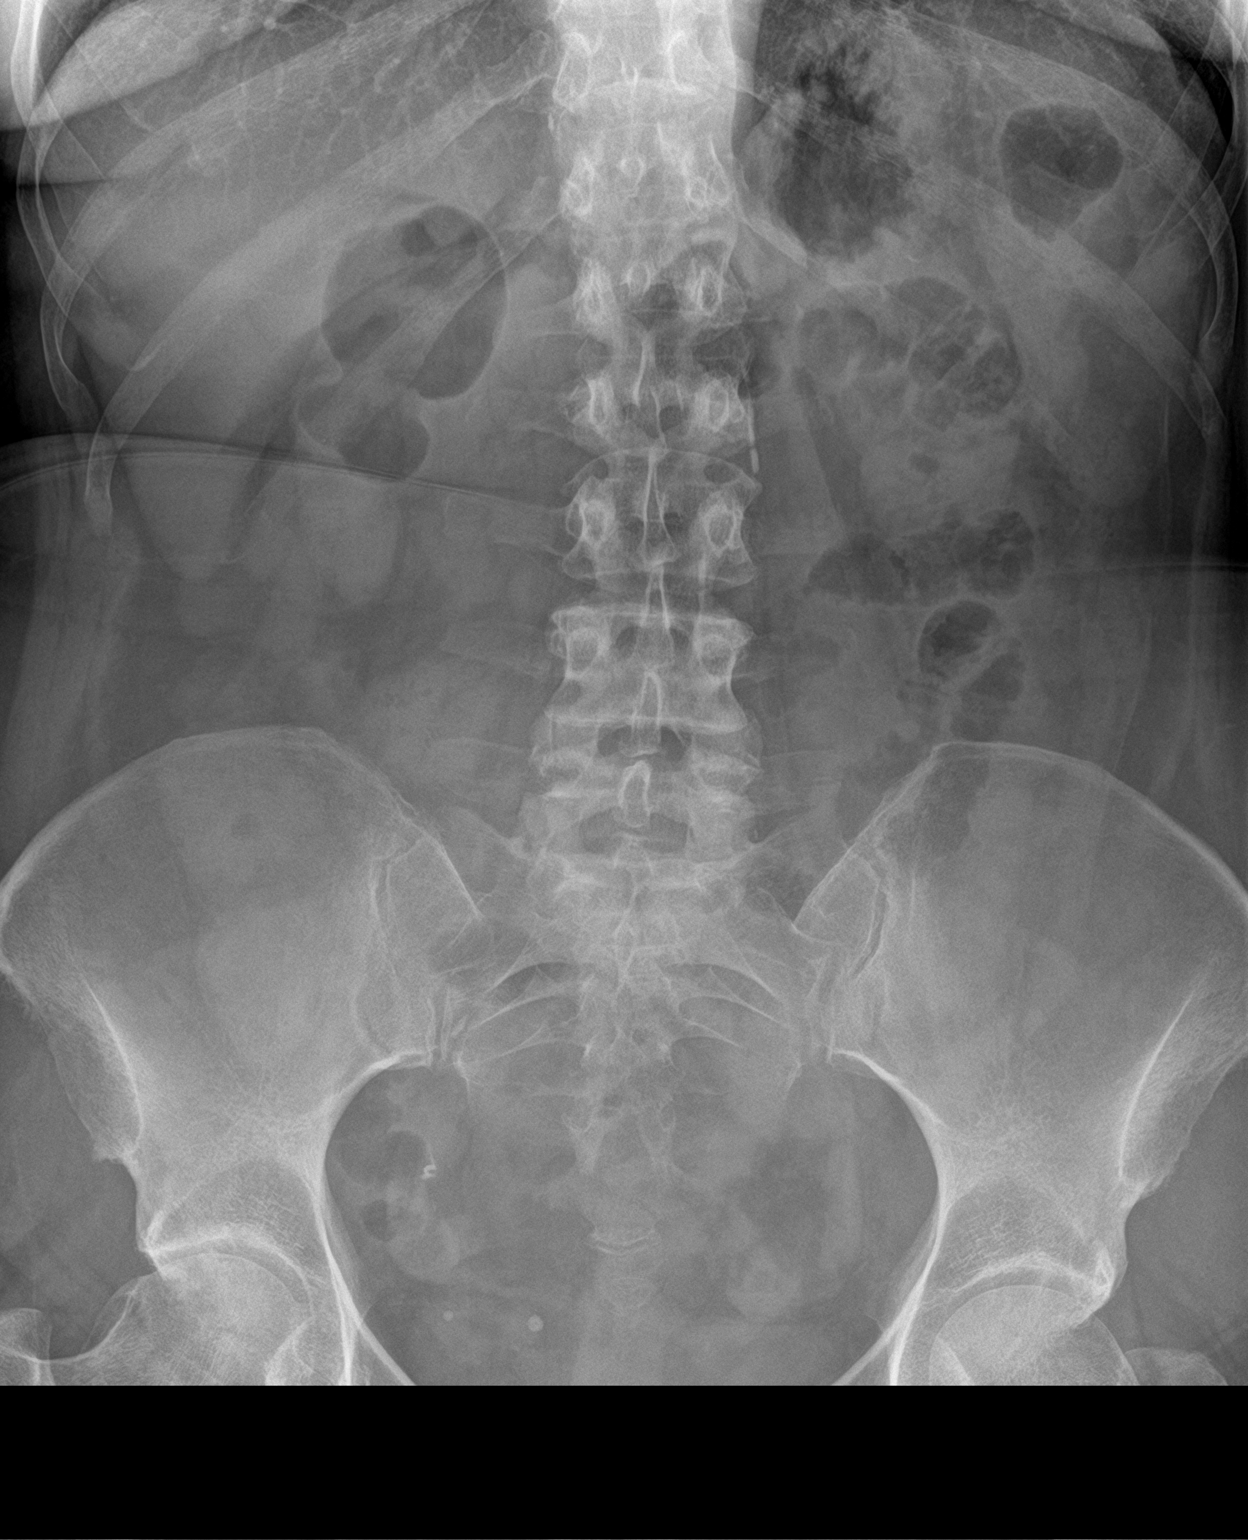

[1 of 1 positions shown; findings below may reference images not displayed]

FINDINGS: The bowel gas pattern is normal. No definite nephrolithiasis is
noted. Rounded calculus is seen in right pelvis which corresponds in
position to distal right ureteral calculus seen on prior CT scan.
IMPRESSION: Rounded calculus seen in right pelvis which corresponds in position
to distal right ureteral calculus seen on prior CT scan. No
nephrolithiasis is noted.

## 2021-06-18 ENCOUNTER — Ambulatory Visit
Admission: RE | Admit: 2021-06-18 | Discharge: 2021-06-18 | Disposition: A | Discharge status: Home / Self Care | Payer: BC Managed Care – PPO | Source: Ambulatory Visit | Attending: Urology | Admitting: Urology

## 2021-06-18 DIAGNOSIS — R310 Gross hematuria: Secondary | ICD-10-CM | POA: Diagnosis present

## 2021-06-18 LAB — POCT I-STAT CREATININE: Creatinine, Ser: 1.1 mg/dL — ABNORMAL HIGH (ref 0.44–1.00)

## 2021-06-18 MED ORDER — IOHEXOL 300 MG/ML  SOLN
125.0000 mL | Freq: Once | INTRAMUSCULAR | Status: AC | PRN
  Administered 2021-06-18: 125 mL via INTRAVENOUS

## 2021-07-01 NOTE — Progress Notes (Unsigned)
07/02/21 10:31 AM   April Shields August 10, 1960 [id number]  Referring provider:  No referring provider defined for this encounter.  Urological history  1.  Nephrolithiasis -Stone composition of calcium oxalate -ESWL 03/2019 - 5 mm right distal stone -CTU, 05/2021 - 1 mm non-obstructing stone in the left kidney    2. Mixed incontinence -Contributing factors of age, hypertension, vaginal atrophy and diuretics   3. Gross hematuria  - non-smoker - Underwent cystoscopy on 05/22/2021 with Dr Aleene Davidson. It was unremarkable   Chief Complaint  Patient presents with   Follow-up     HPI: April Shields is a 61 y.o.female who presents today for further evaluation of UTI symptoms.   CTU, 06/18/2021- 1 mm left renal stone, no hydro, no renal masses and both ureters are opacified completely with no filling defects.  She continues to have baseline frequency, urgency and urge incontinence.  She is having lower abdominal pain and lower back pain.  Patient denies any modifying or aggravating factors.  Patient denies any gross hematuria, dysuria or suprapubic/flank pain.  Patient denies any fevers, chills, nausea or vomiting.    She denies any bloody vaginal discharge or bloody stools.  UA benign.   PMH: Past Medical History:  Diagnosis Date   HTN (hypertension)    Kidney stone    Palpitations     Surgical History: Past Surgical History:  Procedure Laterality Date   EXTRACORPOREAL SHOCK WAVE LITHOTRIPSY Right 04/07/2019   Procedure: EXTRACORPOREAL SHOCK WAVE LITHOTRIPSY (ESWL);  Surgeon: Riki Altes, MD;  Location: [facility] ORS;  Service: Urology;  Laterality: Right;    Home Medications:  Allergies as of 07/02/2021       Reactions   Yellow Jacket Venom    Tape Rash        Medication List        Accurate as of July 02, 2021 10:31 AM. If you have any questions, ask your nurse or doctor.          STOP taking these medications    sulfamethoxazole-trimethoprim 800-160 MG  tablet Commonly known as: BACTRIM DS Stopped by: Michiel Cowboy, PA-C       TAKE these medications    aspirin EC 81 MG tablet Take 81 mg by mouth daily. Swallow whole.   atorvastatin 40 MG tablet Commonly known as: LIPITOR Take 40 mg by mouth every morning.   CENTRUM SILVER 50+WOMEN PO Take by mouth.   cholecalciferol 25 MCG (1000 UNIT) tablet Commonly known as: VITAMIN D3 Take 1,000 Units by mouth daily.   famotidine 20 MG tablet Commonly known as: PEPCID Take 20 mg by mouth 2 (two) times daily.   mesalamine 0.375 g 24 hr capsule Commonly known as: APRISO Take 1.5 g by mouth every morning.   metoprolol succinate 25 MG 24 hr tablet Commonly known as: TOPROL-XL Take 25 mg by mouth daily.   nitroGLYCERIN 0.4 MG SL tablet Commonly known as: NITROSTAT SMARTSIG:1 Tablet(s) Sublingual PRN   ranolazine 1000 MG SR tablet Commonly known as: RANEXA Take 1,000 mg by mouth 2 (two) times daily.        Allergies:  Allergies  Allergen Reactions   Yellow Jacket Venom    Tape Rash    Family History: History reviewed. No pertinent family history.  Social History:  reports that she has never smoked. She has never been exposed to tobacco smoke. She has never used smokeless tobacco. She reports that she does not currently use alcohol. She reports that she does not  use drugs.   Physical Exam: BP 124/80   Pulse (!) 59   Ht 5\' 2"  (1.575 m)   Wt 163 lb (73.9 kg)   BMI 29.81 kg/m   Constitutional:  Well nourished. Alert and oriented, No acute distress. HEENT: [place] AT, moist mucus membranes.  Trachea midline Cardiovascular: No clubbing, cyanosis, or edema. Respiratory: Normal respiratory effort, no increased work of breathing. Neurologic: Grossly intact, no focal deficits, moving all 4 extremities. Psychiatric: Normal mood and affect.    Laboratory Data: Urinalysis Benign.  See HPI.  See epic I have reviewed the labs.   Pertinent Imaging: CLINICAL DATA:  Gross  hematuria   EXAM: CT ABDOMEN AND PELVIS WITHOUT AND WITH CONTRAST   TECHNIQUE: Multidetector CT imaging of the abdomen and pelvis was performed following the standard protocol before and following the bolus administration of intravenous contrast.   RADIATION DOSE REDUCTION: This exam was performed according to the departmental dose-optimization program which includes automated exposure control, adjustment of the mA and/or kV according to patient size and/or use of iterative reconstruction technique.   CONTRAST:  OMNIPAQUE IOHEXOL 300 MG/ML  SOLN   COMPARISON:  CT 04/05/2019   FINDINGS: Lower chest: Lung bases are clear.   Hepatobiliary: No focal hepatic lesion. No biliary duct dilatation. Common bile duct is normal.   Pancreas: Pancreas is normal. No ductal dilatation. No pancreatic inflammation.   Spleen: Normal spleen   Adrenals/urinary tract: Adrenal glands normal.   1 mm nonobstructing calculus in the mid LEFT kidney. No ureterolithiasis. No enhancing renal cortical lesion.   No filling defects within collecting systems ureters.   No bladder calculi, enhancing bladder lesions, or filling defect within the bladder.   Stomach/Bowel: Stomach, small bowel, appendix, and cecum are normal. The colon and rectosigmoid colon are normal.   Vascular/Lymphatic: Abdominal aorta is normal caliber with atherosclerotic calcification. There is no retroperitoneal or periportal lymphadenopathy. No pelvic lymphadenopathy.   Reproductive: Uterus and adnexa unremarkable.   Other: No free fluid.   Musculoskeletal: No aggressive osseous lesion.   IMPRESSION: 1. No explanation for hematuria. No ureterolithiasis, enhancing renal cortical lesion, or filling defects within the collecting systems. 2. Single punctate nonobstructing calculus in the LEFT kidney. 3. No bladder stones or filling defects in the bladder which does not excluded a bladder lesion. 4.  Aortic  Atherosclerosis (ICD10-I70.0).     Electronically Signed   By: 06/05/2019 M.D.   On: 06/20/2021 09:06  I have independently reviewed the films.  See HPI.    Assessment & Plan:    1. Gross hematuria -recent work up - NED -no reports of gross heme -UA negative for micro heme -need to consider other sources of blood - such as a Gyn or GI source -She denies any bloody vaginal discharge or bloody stool, defers referral to gynecologist -She is due for a screening colonoscopy, so she will contact her PCP regarding this  2. Suprapubic pain -UA benign -Urine sent for mycoplasma/Ureaplasma   Return for OAB questionnaire, PVR, UA and PVR .  06/22/2021, PA-C   [facility] [address], [address] [place], [place] [phone]

## 2021-07-02 ENCOUNTER — Ambulatory Visit: Payer: BC Managed Care – PPO | Admitting: Urology

## 2021-07-02 ENCOUNTER — Encounter: Payer: Self-pay | Admitting: Urology

## 2021-07-02 VITALS — BP 124/80 | HR 59 | Ht 62.0 in | Wt 163.0 lb

## 2021-07-02 DIAGNOSIS — M545 Low back pain, unspecified: Secondary | ICD-10-CM

## 2021-07-02 DIAGNOSIS — R3915 Urgency of urination: Secondary | ICD-10-CM

## 2021-07-02 DIAGNOSIS — R350 Frequency of micturition: Secondary | ICD-10-CM

## 2021-07-02 DIAGNOSIS — N3941 Urge incontinence: Secondary | ICD-10-CM

## 2021-07-02 DIAGNOSIS — N200 Calculus of kidney: Secondary | ICD-10-CM

## 2021-07-02 DIAGNOSIS — R310 Gross hematuria: Secondary | ICD-10-CM

## 2021-07-02 DIAGNOSIS — R1030 Lower abdominal pain, unspecified: Secondary | ICD-10-CM

## 2021-07-02 LAB — URINALYSIS, COMPLETE
Appearance Ur: NEGATIVE
Bilirubin, UA: NEGATIVE
Glucose, UA: NEGATIVE
Ketones, UA: NEGATIVE
Leukocytes,UA: NEGATIVE
Nitrite, UA: NEGATIVE
Protein,UA: NEGATIVE
RBC, UA: NEGATIVE
Specific Gravity, UA: 1.015 (ref 1.005–1.030)
Urobilinogen, Ur: 0.2 mg/dL (ref 0.2–1.0)

## 2021-07-02 LAB — MICROSCOPIC EXAMINATION: Bacteria, UA: NONE SEEN

## 2021-07-09 ENCOUNTER — Telehealth: Payer: Self-pay

## 2021-07-09 LAB — MYCOPLASMA / UREAPLASMA CULTURE
Mycoplasma hominis Culture: POSITIVE — AB
Ureaplasma urealyticum: NEGATIVE

## 2021-07-09 MED ORDER — DOXYCYCLINE HYCLATE 100 MG PO CAPS: 100.0000 mg | ORAL_CAPSULE | Freq: Two times a day (BID) | ORAL | 0 refills | Status: AC

## 2021-07-09 NOTE — Telephone Encounter (Signed)
OK per DPR, [facility] notifying pt of results and Abx RX. Advised pt of sun sensitivity while on doxy. Advised pt to call office if she has any questions.

## 2021-07-09 NOTE — Telephone Encounter (Signed)
-----   Message from [name], PA-C sent at 07/09/2021  4:47 PM EDT ----- Please let April Shields know that she grew out an atypical bacteria in her urine and we need to treat with doxycycline 100 mg twice daily for seven days.  Doxycycline will make a person more sensitive to the sun, so I recommend to take precautions while on the antibiotic by spending less time in the sun, using high SPF sunscreen and/or covering to prevent sunburn.

## 2022-06-30 NOTE — Progress Notes (Unsigned)
07/02/21 10:31 AM   April Shields 1960/03/04 [id number]  Referring provider:  No referring provider defined for this encounter.  Urological history  1.  Nephrolithiasis -Stone composition of calcium oxalate -ESWL 03/2019 - 5 mm right distal stone -CTU, 05/2021 - 1 mm non-obstructing stone in the left kidney    2. Mixed incontinence -Contributing factors of age, hypertension, vaginal atrophy and diuretics   3. High risk hematuria - non-smoker - CTU (05/2021) - no malignancies - Cysto (05/22/2021) - NED    Chief Complaint  Patient presents with   Follow-up     HPI: April Shields is a 62 y.o.female who presents today for yearly follow up.   UA ***  PVR ***  PMH: Past Medical History:  Diagnosis Date   HTN (hypertension)    Kidney stone    Palpitations     Surgical History: Past Surgical History:  Procedure Laterality Date   EXTRACORPOREAL SHOCK WAVE LITHOTRIPSY Right 04/07/2019   Procedure: EXTRACORPOREAL SHOCK WAVE LITHOTRIPSY (ESWL);  Surgeon: Riki Altes, MD;  Location: [facility] ORS;  Service: Urology;  Laterality: Right;    Home Medications:  Allergies as of 07/02/2021       Reactions   Yellow Jacket Venom    Tape Rash        Medication List        Accurate as of July 02, 2021 10:31 AM. If you have any questions, ask your nurse or doctor.          STOP taking these medications    sulfamethoxazole-trimethoprim 800-160 MG tablet Commonly known as: BACTRIM DS Stopped by: Michiel Cowboy, PA-C       TAKE these medications    aspirin EC 81 MG tablet Take 81 mg by mouth daily. Swallow whole.   atorvastatin 40 MG tablet Commonly known as: LIPITOR Take 40 mg by mouth every morning.   CENTRUM SILVER 50+WOMEN PO Take by mouth.   cholecalciferol 25 MCG (1000 UNIT) tablet Commonly known as: VITAMIN D3 Take 1,000 Units by mouth daily.   famotidine 20 MG tablet Commonly known as: PEPCID Take 20 mg by mouth 2 (two) times daily.   mesalamine  0.375 g 24 hr capsule Commonly known as: APRISO Take 1.5 g by mouth every morning.   metoprolol succinate 25 MG 24 hr tablet Commonly known as: TOPROL-XL Take 25 mg by mouth daily.   nitroGLYCERIN 0.4 MG SL tablet Commonly known as: NITROSTAT SMARTSIG:1 Tablet(s) Sublingual PRN   ranolazine 1000 MG SR tablet Commonly known as: RANEXA Take 1,000 mg by mouth 2 (two) times daily.        Allergies:  Allergies  Allergen Reactions   Yellow Jacket Venom    Tape Rash    Family History: History reviewed. No pertinent family history.  Social History:  reports that she has never smoked. She has never been exposed to tobacco smoke. She has never used smokeless tobacco. She reports that she does not currently use alcohol. She reports that she does not use drugs.   Physical Exam: BP 124/80   Pulse (!) 59   Ht 5\' 2"  (1.575 m)   Wt 163 lb (73.9 kg)   BMI 29.81 kg/m   Constitutional:  Well nourished. Alert and oriented, No acute distress. HEENT: [place] AT, moist mucus membranes.  Trachea midline, no masses. Cardiovascular: No clubbing, cyanosis, or edema. Respiratory: Normal respiratory effort, no increased work of breathing. GU: No CVA tenderness.  No bladder fullness or masses. Vulvovaginal atrophy w/  pallor, loss of rugae, introital retraction, excoriations.  Vulvar thinning, fusion of labia, clitoral hood retraction, prominent urethral meatus.   *** external genitalia, *** pubic hair distribution, no lesions.  Normal urethral meatus, no lesions, no prolapse, no discharge.   No urethral masses, tenderness and/or tenderness. No bladder fullness, tenderness or masses. *** vagina mucosa, *** estrogen effect, no discharge, no lesions, *** pelvic support, *** cystocele and *** rectocele noted.  No cervical motion tenderness.  Uterus is freely mobile and non-fixed.  No adnexal/parametria masses or tenderness noted.  Anus and perineum are without rashes or lesions.   ***  Neurologic: Grossly  intact, no focal deficits, moving all 4 extremities. Psychiatric: Normal mood and affect.    Laboratory Data: Urinalysis See EPIC and HPI I have reviewed the labs.   Pertinent Imaging: ***     Assessment & Plan:    1. Gross hematuria -work up 2023 - NED -no reports of gross heme -UA negative for micro heme  2. Nephrolithiasis ***  3. Mixed incontinence ***   Return for OAB questionnaire, PVR, UA and PVR .  Michiel Cowboy, PA-C   [facility] [address] [place], [place] [address] [phone]

## 2022-07-01 ENCOUNTER — Ambulatory Visit
Admission: RE | Admit: 2022-07-01 | Discharge: 2022-07-01 | Disposition: A | Discharge status: Home / Self Care | Payer: BC Managed Care – PPO | Source: Ambulatory Visit | Attending: Urology | Admitting: Urology

## 2022-07-01 ENCOUNTER — Ambulatory Visit
Admission: RE | Admit: 2022-07-01 | Discharge: 2022-07-01 | Disposition: A | Discharge status: Home / Self Care | Payer: BC Managed Care – PPO | Attending: Urology | Admitting: Urology

## 2022-07-01 ENCOUNTER — Encounter: Payer: Self-pay | Admitting: Urology

## 2022-07-01 ENCOUNTER — Ambulatory Visit: Payer: BC Managed Care – PPO | Admitting: Urology

## 2022-07-01 VITALS — BP 140/84 | HR 61 | Ht 62.0 in | Wt 162.0 lb

## 2022-07-01 DIAGNOSIS — N3946 Mixed incontinence: Secondary | ICD-10-CM | POA: Diagnosis not present

## 2022-07-01 DIAGNOSIS — N200 Calculus of kidney: Secondary | ICD-10-CM

## 2022-07-01 DIAGNOSIS — Z87442 Personal history of urinary calculi: Secondary | ICD-10-CM | POA: Diagnosis not present

## 2022-07-01 DIAGNOSIS — R319 Hematuria, unspecified: Secondary | ICD-10-CM

## 2022-07-01 LAB — URINALYSIS, COMPLETE
Bilirubin, UA: NEGATIVE
Glucose, UA: NEGATIVE
Ketones, UA: NEGATIVE
Nitrite, UA: NEGATIVE
Protein,UA: NEGATIVE
RBC, UA: NEGATIVE
Specific Gravity, UA: >1.03 — ABNORMAL HIGH (ref 1.005–1.030)
Urobilinogen, Ur: 0.2 mg/dL (ref 0.2–1.0)
pH, UA: 5.5 (ref 5.0–7.5)

## 2022-07-01 LAB — MICROSCOPIC EXAMINATION: Epithelial Cells (non renal): >10 /hpf — AB (ref 0–10)

## 2022-07-01 LAB — BLADDER SCAN AMB NON-IMAGING: Scan Result: 3

## 2022-07-08 LAB — MYCOPLASMA / UREAPLASMA CULTURE
Mycoplasma hominis Culture: NEGATIVE
Ureaplasma urealyticum: NEGATIVE

## 2023-06-28 ENCOUNTER — Other Ambulatory Visit: Payer: Self-pay | Admitting: Urology

## 2023-06-28 DIAGNOSIS — N200 Calculus of kidney: Secondary | ICD-10-CM

## 2023-06-28 NOTE — Progress Notes (Unsigned)
 07/01/23 9:08 AM   April Shields 03-Aug-1960 [id number]  Referring provider:  Wylie Heard, MD [address] [place],  [place] [address]  Urological history  1.  Nephrolithiasis -Stone composition of calcium oxalate -ESWL 03/2019 - 5 mm right distal stone -CTU, 05/2021 - 1 mm non-obstructing stone in the left kidney    2. Mixed incontinence  3. High risk hematuria - non-smoker - CTU (05/2021) - no malignancies - Cysto (05/22/2021) - NED    Chief Complaint  Patient presents with   Follow-up    HPI: April Shields is a 63 y.o.female who presents today for yearly follow up.   Previous records reviewed.     She is a 1-7 daytime voids, 1-2 episodes of nocturia with strong urge to urinate.  She does have stress urinary incontinence.  She wears 2 panty liners daily when she is on outings.  She does not limit fluid intake.  She does engage in toilet mapping.  Patient denies any modifying or aggravating factors.  Patient denies any recent UTI's, gross hematuria, dysuria or suprapubic/flank pain.  Patient denies any fevers, chills, nausea or vomiting.    UA yellow clear, specific gravity greater than 1.030, pH 6.0, 0-5 WBCs, 0-2 RBCs, greater than 10 epithelial cells, mucus threads present and moderate bacteria.  KUB negative for stone  PMH: Past Medical History:  Diagnosis Date   HTN (hypertension)    Kidney stone    Palpitations     Surgical History: Past Surgical History:  Procedure Laterality Date   EXTRACORPOREAL SHOCK WAVE LITHOTRIPSY Right 04/07/2019   Procedure: EXTRACORPOREAL SHOCK WAVE LITHOTRIPSY (ESWL);  Surgeon: Geraline Knapp, MD;  Location: [facility] ORS;  Service: Urology;  Laterality: Right;    Home Medications:  Allergies as of 07/01/2023       Reactions   Yellow Jacket Venom    Tape Rash        Medication List        Accurate as of July 01, 2023  9:08 AM. If you have any questions, ask your nurse or doctor.          STOP taking these  medications    mesalamine 0.375 g 24 hr capsule Commonly known as: APRISO Stopped by: Matilde Son       TAKE these medications    aspirin EC 81 MG tablet Take 81 mg by mouth daily. Swallow whole.   atorvastatin 40 MG tablet Commonly known as: LIPITOR Take 40 mg by mouth every morning.   cholecalciferol 25 MCG (1000 UNIT) tablet Commonly known as: VITAMIN D3 Take 1,000 Units by mouth daily.   ezetimibe 10 MG tablet Commonly known as: ZETIA Take 1 tablet by mouth daily.   famotidine 20 MG tablet Commonly known as: PEPCID Take 20 mg by mouth 2 (two) times daily.   metoprolol succinate 25 MG 24 hr tablet Commonly known as: TOPROL-XL Take 25 mg by mouth daily.   nitroGLYCERIN 0.4 MG SL tablet Commonly known as: NITROSTAT SMARTSIG:1 Tablet(s) Sublingual PRN   ranolazine 1000 MG SR tablet Commonly known as: RANEXA Take 1,000 mg by mouth 2 (two) times daily.        Allergies:  Allergies  Allergen Reactions   Yellow Jacket Venom    Tape Rash    Family History: No family history on file.  Social History:  reports that she has never smoked. She has never been exposed to tobacco smoke. She has never used smokeless tobacco. She reports  that she does not currently use alcohol. She reports that she does not use drugs.   Physical Exam: BP 139/84   Pulse 87   Ht 5\' 2"  (1.575 m)   Wt 167 lb 1.6 oz (75.8 kg)   BMI 30.56 kg/m   Constitutional:  Well nourished. Alert and oriented, No acute distress. HEENT: [place] AT, moist mucus membranes.  Trachea midline Cardiovascular: No clubbing, cyanosis, or edema. Respiratory: Normal respiratory effort, no increased work of breathing. Neurologic: Grossly intact, no focal deficits, moving all 4 extremities. Psychiatric: Normal mood and affect.     Laboratory Data: Urinalysis See EPIC and HPI I have reviewed the labs.   Pertinent Imaging: KUB no stones seen  I have independently reviewed the films.  Radiologist  interpretation still pending.    Assessment & Plan:    1. Gross hematuria -work up 2023 - NED -no reports of gross heme -UA negative for micro heme  2. Nephrolithiasis -Not appreciated on today's KUB  3. Mixed incontinence -not bothersome  -continue to manage conservatively   Return in about 1 year (around 06/30/2024) for KUB, UA and OAB questionnaire.  Briant Camper   [facility] [address], [address] [place], [place] [address] [phone]

## 2023-07-01 ENCOUNTER — Ambulatory Visit
Admission: RE | Admit: 2023-07-01 | Discharge: 2023-07-01 | Disposition: A | Discharge status: Home / Self Care | Attending: Urology | Admitting: Urology

## 2023-07-01 ENCOUNTER — Ambulatory Visit (INDEPENDENT_AMBULATORY_CARE_PROVIDER_SITE_OTHER): Payer: Self-pay | Admitting: Urology

## 2023-07-01 ENCOUNTER — Encounter: Payer: Self-pay | Admitting: Urology

## 2023-07-01 ENCOUNTER — Ambulatory Visit
Admission: RE | Admit: 2023-07-01 | Discharge: 2023-07-01 | Disposition: A | Discharge status: Home / Self Care | Source: Ambulatory Visit | Attending: Urology | Admitting: Urology

## 2023-07-01 VITALS — BP 139/84 | HR 87 | Ht 62.0 in | Wt 167.1 lb

## 2023-07-01 DIAGNOSIS — R319 Hematuria, unspecified: Secondary | ICD-10-CM

## 2023-07-01 DIAGNOSIS — N200 Calculus of kidney: Secondary | ICD-10-CM

## 2023-07-01 DIAGNOSIS — Z87442 Personal history of urinary calculi: Secondary | ICD-10-CM | POA: Diagnosis not present

## 2023-07-01 DIAGNOSIS — Z87898 Personal history of other specified conditions: Secondary | ICD-10-CM

## 2023-07-01 DIAGNOSIS — N3946 Mixed incontinence: Secondary | ICD-10-CM

## 2023-07-01 LAB — MICROSCOPIC EXAMINATION: Epithelial Cells (non renal): >10 /HPF — AB (ref 0–10)

## 2023-07-01 LAB — URINALYSIS, COMPLETE
Bilirubin, UA: NEGATIVE
Glucose, UA: NEGATIVE
Ketones, UA: NEGATIVE
Leukocytes,UA: NEGATIVE
Nitrite, UA: NEGATIVE
Protein,UA: NEGATIVE
RBC, UA: NEGATIVE
Specific Gravity, UA: 1.03 (ref 1.005–1.030)
Urobilinogen, Ur: 0.2 mg/dL (ref 0.2–1.0)
pH, UA: 6 (ref 5.0–7.5)

## 2024-06-30 ENCOUNTER — Ambulatory Visit: Admitting: Urology
